# Patient Record
Sex: Female | Born: 1970 | Race: White | Hispanic: No | Marital: Married | Smoking: Current every day smoker
Health system: Southern US, Community
[De-identification: ages and names within clinical notes are randomized; demographics above are authoritative.]

## PROBLEM LIST (undated history)

## (undated) DIAGNOSIS — K635 Polyp of colon: Secondary | ICD-10-CM

## (undated) DIAGNOSIS — K219 Gastro-esophageal reflux disease without esophagitis: Secondary | ICD-10-CM

## (undated) DIAGNOSIS — E039 Hypothyroidism, unspecified: Secondary | ICD-10-CM

## (undated) DIAGNOSIS — E785 Hyperlipidemia, unspecified: Secondary | ICD-10-CM

## (undated) DIAGNOSIS — J45909 Unspecified asthma, uncomplicated: Secondary | ICD-10-CM

## (undated) HISTORY — DX: Hyperlipidemia, unspecified: E78.5

## (undated) HISTORY — DX: Hypothyroidism, unspecified: E03.9

## (undated) HISTORY — DX: Unspecified asthma, uncomplicated: J45.909

## (undated) HISTORY — DX: Polyp of colon: K63.5

## (undated) HISTORY — DX: Gastro-esophageal reflux disease without esophagitis: K21.9

## (undated) HISTORY — PX: TUBAL LIGATION: SHX77

---

## 2015-04-11 ENCOUNTER — Encounter: Payer: Self-pay | Admitting: Pediatrics

## 2015-04-11 ENCOUNTER — Ambulatory Visit (INDEPENDENT_AMBULATORY_CARE_PROVIDER_SITE_OTHER): Payer: Self-pay | Admitting: Pediatrics

## 2015-04-11 ENCOUNTER — Encounter (INDEPENDENT_AMBULATORY_CARE_PROVIDER_SITE_OTHER): Payer: Self-pay

## 2015-04-11 VITALS — BP 145/89 | HR 92 | Temp 97.0°F | Ht 64.0 in | Wt 138.0 lb

## 2015-04-11 DIAGNOSIS — E785 Hyperlipidemia, unspecified: Secondary | ICD-10-CM

## 2015-04-11 DIAGNOSIS — R8281 Pyuria: Secondary | ICD-10-CM

## 2015-04-11 DIAGNOSIS — R300 Dysuria: Secondary | ICD-10-CM

## 2015-04-11 DIAGNOSIS — Z716 Tobacco abuse counseling: Secondary | ICD-10-CM

## 2015-04-11 DIAGNOSIS — Z0000 Encounter for general adult medical examination without abnormal findings: Secondary | ICD-10-CM

## 2015-04-11 DIAGNOSIS — N390 Urinary tract infection, site not specified: Secondary | ICD-10-CM

## 2015-04-11 DIAGNOSIS — J4520 Mild intermittent asthma, uncomplicated: Secondary | ICD-10-CM

## 2015-04-11 LAB — POCT URINALYSIS DIPSTICK
BILIRUBIN UA: NEGATIVE
Blood, UA: NEGATIVE
GLUCOSE UA: NEGATIVE
KETONES UA: NEGATIVE
Nitrite, UA: NEGATIVE
PROTEIN UA: NEGATIVE
SPEC GRAV UA: 1.01
Urobilinogen, UA: NEGATIVE
pH, UA: 7.5

## 2015-04-11 LAB — POCT UA - MICROSCOPIC ONLY
BACTERIA, U MICROSCOPIC: NEGATIVE
CRYSTALS, UR, HPF, POC: NEGATIVE
Casts, Ur, LPF, POC: NEGATIVE
MUCUS UA: NEGATIVE
RBC, URINE, MICROSCOPIC: NEGATIVE
Yeast, UA: NEGATIVE

## 2015-04-11 MED ORDER — ALBUTEROL SULFATE 108 (90 BASE) MCG/ACT IN AEPB: 2.0000 | INHALATION_SPRAY | Freq: Four times a day (QID) | RESPIRATORY_TRACT | Status: DC | PRN

## 2015-04-11 MED ORDER — NITROFURANTOIN MONOHYD MACRO 100 MG PO CAPS: 100.0000 mg | ORAL_CAPSULE | Freq: Two times a day (BID) | ORAL | Status: DC

## 2015-04-11 NOTE — Progress Notes (Addendum)
Subjective:    Patient ID: Deborah Osborne, female    DOB: [birth year], 44 y.o.   MRN: [id number]  HPI: Deborah Osborne is a 44 y.o. female presenting on 04/11/2015 for New Patient (Initial Visit)  Burning at end of urination started 4-5 days ago, also some itching. No fevers.   Started smoking at 44yo.  Sometimes has a hard tim ebreathing in the heat. Smokes 2 ppd. When breathing trouble worsens, she will stop and rest and it improves. Has never needed to go to the hospital for it.   H/o abnormal pap at age 82yo, had cryotherapy at that time.  Last pap smear was over ten years ago, she reports it being normal.   Relevant past medical, surgical, family and social history reviewed and updated as indicated. Interim medical history since our last visit reviewed. Allergies and medications reviewed and updated.  Review of Systems  HENT: Negative.   Eyes: Negative.   Respiratory: Positive for cough and sputum production. Negative for hemoptysis.   Cardiovascular: Negative.   Gastrointestinal: Negative.   Genitourinary: Negative.   Musculoskeletal: Negative.   Skin: Negative.   Neurological: Negative.   Psychiatric/Behavioral: Negative.     ROS: Per HPI unless specifically indicated above  Past Medical History There are no active problems to display for this patient.   Current Outpatient Prescriptions  Medication Sig Dispense Refill  . acetaminophen (TYLENOL) 325 MG tablet Take 650 mg by mouth every 6 (six) hours as needed.    . Albuterol Sulfate (PROAIR RESPICLICK) 962 (90 BASE) MCG/ACT AEPB Inhale 2 puffs into the lungs every 6 (six) hours as needed. 1 each 2  . nitrofurantoin, macrocrystal-monohydrate, (MACROBID) 100 MG capsule Take 1 capsule (100 mg total) by mouth 2 (two) times daily. 10 capsule 0  . omeprazole (PRILOSEC OTC) 20 MG tablet Take 20 mg by mouth daily.     No current facility-administered medications for this visit.       Objective:    BP 145/89 mmHg   Pulse 92  Temp(Src) 97 F (36.1 C) (Oral)  Ht _0  (1.626 m)  Wt 138 lb (62.596 kg)  BMI 23.68 kg/m2  Wt Readings from Last 3 Encounters:  04/11/15 138 lb (62.596 kg)    Gen: NAD, alert, cooperative with exam, NCAT, coarse voice EYES: EOMI, no scleral injection or icterus ENT:  TMs pearly gray b/l, OP without erythema LYMPH: no cervical LAD NECK: normal thyroid, no nodules CV: NRRR, normal S1/S2, no murmur, DP pulses 2+ b/l Resp: moving air fair, scattered wheezes throughout, normal WOB Abd: +BS, soft, NTND. no guarding or organomegaly PELVIC: normal external female genitalia, no discharge. Parous, pink cervix with no lesions. Normal vaginal wall rugae, no lesions. Ext: No edema, warm Neuro: Alert and oriented, strength equal b/l UE and LE, coodrination grossly normal MSK: normal muscle bulk     Assessment & Plan:   Llewellyn was seen today for new patient (initial visit).  Diagnoses and all orders for this visit:  Encounter for preventive health examination -     Lipid panel -     Pap IG and HPV (high risk) DNA detection -     BMP8+EGFR  Dysuria With pyuria, macrobid BID for 5 days. Will f/u urine culture.  Asthma, chronic, mild intermittent, uncomplicated vs COPD Would benefit from PFTs but as pt is self pay now she declined. Gave below Rx, if using more than twice a week she needs to come back to be seen to start  a controller medicine. -     Albuterol Sulfate (PROAIR RESPICLICK) 841 (90 BASE) MCG/ACT AEPB; Inhale 2 puffs into the lungs every 6 (six) hours as needed.  Tobacco abuse counseling She is interested in quitting smoking. She has tried a couple times in the past but has gone back to smoking within 2-3 weeks because of stress. Talked about having a plan to quit before quitting. Pt would like to use patches to help. Encouraged her to write out a date and plan on a calendar and to think through what her triggers are (stress, work) and think of something else she can do  instead, such as go for walk. Follow up in 4 weeks or sooner if needed.    Follow up plan: In 4 weeks for smoking cessation or sooner if needed.  Assunta Found, MD [facility] 04/11/2015, 12:09 PM  ADDENDUM 04/18/2015 5:38 PM  Pap smear negative. No vaginal discharge, if itching starts rec monostat. Start pravastatin for hyperlipidemia. Called and discussed with pt, ASCVD 10 yr risk is 14.% given h/o smoking and current hypertension and high cholesterol levels. RTC 4-8 weeks to follow up smoking cessation.

## 2015-04-12 LAB — BMP8+EGFR
BUN/Creatinine Ratio: 10 (ref 9–23)
BUN: 9 mg/dL (ref 6–24)
CALCIUM: 9.9 mg/dL (ref 8.7–10.2)
CHLORIDE: 99 mmol/L (ref 97–108)
CO2: 21 mmol/L (ref 18–29)
Creatinine, Ser: 0.86 mg/dL (ref 0.57–1.00)
GFR, EST AFRICAN AMERICAN: 96 mL/min/{1.73_m2} (ref >59)
GFR, EST NON AFRICAN AMERICAN: 83 mL/min/{1.73_m2} (ref >59)
Glucose: 86 mg/dL (ref 65–99)
Potassium: 5 mmol/L (ref 3.5–5.2)
Sodium: 140 mmol/L (ref 134–144)

## 2015-04-12 LAB — LIPID PANEL
CHOLESTEROL TOTAL: 262 mg/dL — AB (ref 100–199)
Chol/HDL Ratio: 7.5 ratio units — ABNORMAL HIGH (ref 0.0–4.4)
HDL: 35 mg/dL — ABNORMAL LOW (ref >39)
LDL Calculated: 183 mg/dL — ABNORMAL HIGH (ref 0–99)
TRIGLYCERIDES: 219 mg/dL — AB (ref 0–149)
VLDL Cholesterol Cal: 44 mg/dL — ABNORMAL HIGH (ref 5–40)

## 2015-04-12 LAB — URINE CULTURE

## 2015-04-16 LAB — PAP IG AND HPV HIGH-RISK
HPV, high-risk: NEGATIVE
PAP Smear Comment: 0

## 2015-04-18 MED ORDER — PRAVASTATIN SODIUM 40 MG PO TABS: 40.0000 mg | ORAL_TABLET | Freq: Every day | ORAL | Status: DC

## 2015-04-18 NOTE — Addendum Note (Signed)
Addended by: Johna Sheriff on: 04/18/2015 05:38 PM   Modules accepted: Orders

## 2015-07-11 ENCOUNTER — Telehealth: Payer: Self-pay | Admitting: Pediatrics

## 2015-07-15 ENCOUNTER — Ambulatory Visit (INDEPENDENT_AMBULATORY_CARE_PROVIDER_SITE_OTHER): Payer: Self-pay | Admitting: Family Medicine

## 2015-07-15 ENCOUNTER — Encounter: Payer: Self-pay | Admitting: Family Medicine

## 2015-07-15 ENCOUNTER — Ambulatory Visit (INDEPENDENT_AMBULATORY_CARE_PROVIDER_SITE_OTHER): Payer: Self-pay

## 2015-07-15 VITALS — BP 141/81 | HR 81 | Temp 97.6°F | Ht 64.0 in | Wt 140.8 lb

## 2015-07-15 DIAGNOSIS — W19XXXA Unspecified fall, initial encounter: Secondary | ICD-10-CM

## 2015-07-15 DIAGNOSIS — W0110XA Fall on same level from slipping, tripping and stumbling with subsequent striking against unspecified object, initial encounter: Secondary | ICD-10-CM

## 2015-07-15 DIAGNOSIS — M25562 Pain in left knee: Secondary | ICD-10-CM

## 2015-07-15 DIAGNOSIS — L03011 Cellulitis of right finger: Secondary | ICD-10-CM

## 2015-07-15 MED ORDER — SULFAMETHOXAZOLE-TRIMETHOPRIM 800-160 MG PO TABS: 1.0000 | ORAL_TABLET | Freq: Two times a day (BID) | ORAL | Status: DC

## 2015-07-15 NOTE — Progress Notes (Signed)
BP 141/81 mmHg  Pulse 81  Temp(Src) 97.6 F (36.4 C) (Oral)  Ht 5' 4"  (1.626 m)  Wt 140 lb 12.8 oz (63.866 kg)  BMI 24.16 kg/m2  LMP 07/15/2015   Subjective:    Patient ID: Deborah Osborne, female    DOB: 1971/06/08, 44 y.o.   MRN: [id number]  HPI: Deborah Osborne is a 44 y.o. 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 Since that time she has developed swelling and redness and inflammation in significant pain. Yesterday she lanced on her own and use peroxide. Today it came back just as vague and painful. She denies any fevers or chills. She denies any skin infections anywhere else. In her profession she is crawling around a lot of dirty spaces on people's houses.  Fall Today while leaving her house to come to our office she fell and landed right on her left knee on a curb. She is having significant pain over the patella right now because she just did it. She has a small abrasion on her anterior left knee, but no bruising.  Relevant past medical, surgical, family and social history reviewed and updated as indicated. Interim medical history since our last visit reviewed. Allergies and medications reviewed and updated.  Review of Systems  Constitutional: Negative for fever and chills.  HENT: Negative for congestion, ear discharge and ear pain.   Eyes: Negative for redness and visual disturbance.  Respiratory: Negative for chest tightness and shortness of breath.   Cardiovascular: Negative for chest pain and leg swelling.  Genitourinary: Negative for dysuria and difficulty urinating.  Musculoskeletal: Positive for arthralgias. Negative for back pain, joint swelling and gait problem.  Skin: Positive for color change and rash.  Neurological: Negative for light-headedness and headaches.  Psychiatric/Behavioral: Negative  for behavioral problems and agitation.  All other systems reviewed and are negative.   Per HPI unless specifically indicated above     Medication List       This list is accurate as of: 07/15/15  9:24 AM.  Always use your most recent med list.               Albuterol Sulfate 108 (90 BASE) MCG/ACT Aepb  Commonly known as:  PROAIR RESPICLICK  Inhale 2 puffs into the lungs every 6 (six) hours as needed.     omeprazole 20 MG tablet  Commonly known as:  PRILOSEC OTC  Take 20 mg by mouth daily.     pravastatin 40 MG tablet  Commonly known as:  PRAVACHOL  Take 1 tablet (40 mg total) by mouth daily.     sulfamethoxazole-trimethoprim 800-160 MG tablet  Commonly known as:  BACTRIM DS,SEPTRA DS  Take 1 tablet by mouth 2 (two) times daily.           Objective:    BP 141/81 mmHg  Pulse 81  Temp(Src) 97.6 F (36.4 C) (Oral)  Ht 5' 4"  (1.626 m)  Wt 140 lb 12.8 oz (63.866 kg)  BMI 24.16 kg/m2  LMP 07/15/2015  Wt Readings from Last 3 Encounters:  07/15/15 140 lb 12.8 oz (63.866 kg)  04/11/15 138 lb (62.596 kg)    Physical Exam  Constitutional: She is oriented to person, place, and time. She appears well-developed and well-nourished. No distress.  Eyes: Conjunctivae and EOM are normal. Pupils are equal, round,  and reactive to light.  Cardiovascular: Normal rate, regular rhythm, normal heart sounds and intact distal pulses.   No murmur heard. Pulmonary/Chest: Effort normal and breath sounds normal. No respiratory distress. She has no wheezes.  Musculoskeletal: Normal range of motion. She exhibits no edema or tenderness.  Neurological: She is alert and oriented to person, place, and time. Coordination normal.  Skin: Skin is warm and dry. No rash noted. She is not diaphoretic.     Psychiatric: She has a normal mood and affect. Her behavior is normal.  Nursing note and vitals reviewed.   Results for orders placed or performed in visit on 04/11/15  Urine culture  Result  Value Ref Range   Urine Culture, Routine Final report    Urine Culture result 1 Comment   Lipid panel  Result Value Ref Range   Cholesterol, Total 262 (H) 100 - 199 mg/dL   Triglycerides 219 (H) 0 - 149 mg/dL   HDL 35 (L) >39 mg/dL   VLDL Cholesterol Cal 44 (H) 5 - 40 mg/dL   LDL Calculated 183 (H) 0 - 99 mg/dL   Chol/HDL Ratio 7.5 (H) 0.0 - 4.4 ratio units  BMP8+EGFR  Result Value Ref Range   Glucose 86 65 - 99 mg/dL   BUN 9 6 - 24 mg/dL   Creatinine, Ser 0.86 0.57 - 1.00 mg/dL   GFR calc non Af Amer 83 >59 mL/min/1.73   GFR calc Af Amer 96 >59 mL/min/1.73   BUN/Creatinine Ratio 10 9 - 23   Sodium 140 134 - 144 mmol/L   Potassium 5.0 3.5 - 5.2 mmol/L   Chloride 99 97 - 108 mmol/L   CO2 21 18 - 29 mmol/L   Calcium 9.9 8.7 - 10.2 mg/dL  POCT UA - Microscopic Only  Result Value Ref Range   WBC, Ur, HPF, POC 15-20    RBC, urine, microscopic NEG    Bacteria, U Microscopic NEG    Mucus, UA NEG    Epithelial cells, urine per micros OCC    Crystals, Ur, HPF, POC NEG    Casts, Ur, LPF, POC NEG    Yeast, UA NEG   POCT urinalysis dipstick  Result Value Ref Range   Color, UA YELLOW    Clarity, UA CLOUDY    Glucose, UA NEG    Bilirubin, UA NEG    Ketones, UA NEG    Spec Grav, UA 1.010    Blood, UA NEG    pH, UA 7.5    Protein, UA NEG    Urobilinogen, UA negative    Nitrite, UA NEG    Leukocytes, UA moderate (2+) (A) Negative  Pap IG and HPV (high risk) DNA detection  Result Value Ref Range   DIAGNOSIS: Comment    Specimen adequacy: Comment    CLINICIAN PROVIDED ICD10: Comment    Performed by: Comment    PAP SMEAR COMMENT .    PATHOLOGIST PROVIDED ICD10: Comment    Note: Comment    Test Methodology Comment    HPV, high-risk Negative Negative      Assessment & Plan:   Problem List Items Addressed This Visit    None    Visit Diagnoses    Fall, initial encounter    -  Primary    No signs of fracture on x-ray, await radiology read, likely contusion use NSAIDs     Relevant Orders    DG Knee 1-2 Views Left    Cellulitis of finger of right hand  Middle finger, lanced abscess and sent for culture    Relevant Medications    sulfamethoxazole-trimethoprim (BACTRIM DS,SEPTRA DS) 800-160 MG tablet    Other Relevant Orders    Anaerobic and Aerobic Culture        Follow up plan: Return if symptoms worsen or fail to improve.  Counseling provided for all of the vaccine components Orders Placed This Encounter  Procedures  . Anaerobic and Aerobic Culture  . DG Knee 1-2 Views Left    Caryl Pina, MD [facility] 07/15/2015, 9:24 AM

## 2015-07-19 LAB — ANAEROBIC AND AEROBIC CULTURE

## 2015-08-11 HISTORY — PX: CHOLECYSTECTOMY: SHX55

## 2016-01-16 DIAGNOSIS — K635 Polyp of colon: Secondary | ICD-10-CM

## 2016-01-16 HISTORY — DX: Polyp of colon: K63.5

## 2016-05-21 ENCOUNTER — Ambulatory Visit (INDEPENDENT_AMBULATORY_CARE_PROVIDER_SITE_OTHER): Payer: Self-pay | Admitting: Pediatrics

## 2016-05-21 ENCOUNTER — Encounter: Payer: Self-pay | Admitting: Pediatrics

## 2016-05-21 VITALS — BP 134/83 | HR 71 | Temp 98.2°F | Ht 64.0 in | Wt 134.2 lb

## 2016-05-21 DIAGNOSIS — Z23 Encounter for immunization: Secondary | ICD-10-CM

## 2016-05-21 DIAGNOSIS — F439 Reaction to severe stress, unspecified: Secondary | ICD-10-CM | POA: Insufficient documentation

## 2016-05-21 DIAGNOSIS — Z720 Tobacco use: Secondary | ICD-10-CM

## 2016-05-21 DIAGNOSIS — E782 Mixed hyperlipidemia: Secondary | ICD-10-CM | POA: Insufficient documentation

## 2016-05-21 DIAGNOSIS — E785 Hyperlipidemia, unspecified: Secondary | ICD-10-CM

## 2016-05-21 DIAGNOSIS — Z9049 Acquired absence of other specified parts of digestive tract: Secondary | ICD-10-CM | POA: Insufficient documentation

## 2016-05-21 MED ORDER — PRAVASTATIN SODIUM 40 MG PO TABS: 40.0000 mg | ORAL_TABLET | Freq: Every day | ORAL | 3 refills | Status: DC

## 2016-05-21 NOTE — Patient Instructions (Signed)
Pravastatin nightly

## 2016-05-21 NOTE — Progress Notes (Signed)
  Subjective:   Patient ID: Deborah Osborne, female    DOB: 09/26/1970, 45 y.o.   MRN: [id number] CC: Medication Refill (pravastatin)  HPI: Deborah Osborne is a 45 y.o. female presenting for Medication Refill (pravastatin)  HLD: Takes pravastatin nightly No side effects  Tobacco use: Nerves on edge Lots of stress from kids One with opioid addiction  Had gall bladder removed since last visit  Stress at home: Opioid addiction as above in child  Relevant past medical, surgical, family and social history reviewed. Allergies and medications reviewed and updated. History  Smoking Status  . Current Every Day Smoker  . Packs/day: 2.00  . Years: 20.00  . Types: Cigarettes  Smokeless Tobacco  . Never Used   ROS: Per HPI   Objective:    BP 134/83   Pulse 71   Temp 98.2 F (36.8 C) (Oral)   Ht 5\' 4"  (1.626 m)   Wt 134 lb 3.2 oz (60.9 kg)   BMI 23.04 kg/m   Wt Readings from Last 3 Encounters:  05/21/16 134 lb 3.2 oz (60.9 kg)  07/15/15 140 lb 12.8 oz (63.9 kg)  04/11/15 138 lb (62.6 kg)    Gen: NAD, alert, cooperative with exam, NCAT, slightly hoarse EYES: EOMI, no conjunctival injection, or no icterus ENT:   OP without erythema LYMPH: no cervical LAD CV: NRRR, normal S1/S2, no murmur, distal pulses 2+ b/l Resp: CTABL, no wheezes, normal WOB Ext: No edema, warm Neuro: Alert and oriented, strength equal b/l UE and LE, coordination grossly normal MSK: normal muscle bulk  Assessment & Plan:  Deborah Osborne was seen today for medication refill.  Diagnoses and all orders for this visit:  Hyperlipidemia, unspecified hyperlipidemia type No side effects, cont medication -     pravastatin (PRAVACHOL) 40 MG tablet; Take 1 tablet (40 mg total) by mouth daily.  Tobacco use Pre-contemplative Has patches at home  S/P cholecystectomy No further abd pain  Stress at home Discussed starting daily medication such as citalopram, pt not interested t this time but will let me  know  Follow up plan: Return in about 1 year (around 05/21/2017). Rex Krasarol Vincent, MD [facility]

## 2016-05-25 ENCOUNTER — Ambulatory Visit (INDEPENDENT_AMBULATORY_CARE_PROVIDER_SITE_OTHER): Payer: Self-pay | Admitting: Family Medicine

## 2016-05-25 ENCOUNTER — Telehealth: Payer: Self-pay | Admitting: Pediatrics

## 2016-05-25 ENCOUNTER — Encounter: Payer: Self-pay | Admitting: Family Medicine

## 2016-05-25 VITALS — BP 112/74 | HR 95 | Temp 97.2°F | Ht 64.0 in | Wt 136.4 lb

## 2016-05-25 DIAGNOSIS — S39012A Strain of muscle, fascia and tendon of lower back, initial encounter: Secondary | ICD-10-CM

## 2016-05-25 MED ORDER — CYCLOBENZAPRINE HCL 10 MG PO TABS: 10.0000 mg | ORAL_TABLET | Freq: Three times a day (TID) | ORAL | 0 refills | Status: DC | PRN

## 2016-05-25 NOTE — Progress Notes (Signed)
BP 112/74   Pulse 95   Temp 97.2 F (36.2 C) (Oral)   Ht 5\' 4"  (1.626 m)   Wt 136 lb 6 oz (61.9 kg)   BMI 23.41 kg/m    Subjective:    Patient ID: Deborah Osborne, female    DOB: 04/10/1971, 45 y.o.   MRN: [id number]  HPI: Deborah Osborne is a 45 y.o. female presenting on 05/25/2016 for Back Pain (low back pain, began after pulling a bed while at work)   HPI Low back pain Patient has low back pain that does not radiate down her leg or cause any numbness or weakness. The pain began about 4 days ago when she was pulling on something at work and has been sore since. She denies any inability to walk or any numbness or weakness in either of the legs. She denies any fevers or chills or overlying redness or warmth in her lower back.  Relevant past medical, surgical, family and social history reviewed and updated as indicated. Interim medical history since our last visit reviewed. Allergies and medications reviewed and updated.  Review of Systems  Constitutional: Negative for chills and fever.  HENT: Negative for congestion, ear discharge and ear pain.   Eyes: Negative for redness and visual disturbance.  Respiratory: Negative for chest tightness and shortness of breath.   Cardiovascular: Negative for chest pain and leg swelling.  Endocrine: Negative for cold intolerance.  Genitourinary: Negative for difficulty urinating and dysuria.  Musculoskeletal: Positive for back pain. Negative for gait problem.  Skin: Negative for rash.  Neurological: Negative for dizziness, light-headedness and headaches.  Psychiatric/Behavioral: Negative for agitation and behavioral problems.  All other systems reviewed and are negative.   Per HPI unless specifically indicated above     Objective:    BP 112/74   Pulse 95   Temp 97.2 F (36.2 C) (Oral)   Ht 5\' 4"  (1.626 m)   Wt 136 lb 6 oz (61.9 kg)   BMI 23.41 kg/m   Wt Readings from Last 3 Encounters:  05/25/16 136 lb 6 oz (61.9 kg)  05/21/16  134 lb 3.2 oz (60.9 kg)  07/15/15 140 lb 12.8 oz (63.9 kg)    Physical Exam  Constitutional: She is oriented to person, place, and time. She appears well-developed and well-nourished. No distress.  Eyes: Conjunctivae are normal.  Cardiovascular: Normal rate, regular rhythm, normal heart sounds and intact distal pulses.   No murmur heard. Pulmonary/Chest: Effort normal and breath sounds normal. No respiratory distress. She has no wheezes.  Musculoskeletal: Normal range of motion. She exhibits no edema.       Lumbar back: She exhibits tenderness. She exhibits normal range of motion, no swelling and normal pulse.  Neurological: She is alert and oriented to person, place, and time. Coordination normal.  Skin: Skin is warm and dry. No rash noted. She is not diaphoretic.  Psychiatric: She has a normal mood and affect. Her behavior is normal.  Nursing note and vitals reviewed.     Assessment & Plan:   Problem List Items Addressed This Visit    None    Visit Diagnoses    Lumbar strain, initial encounter    -  Primary   Low back strain while lifting 4 days ago, recommended Advil and muscle relaxer and heating pads and stretching   Relevant Medications   cyclobenzaprine (FLEXERIL) 10 MG tablet      Follow up plan: Return if symptoms worsen or fail to improve.  Counseling provided  for all of the vaccine components No orders of the defined types were placed in this encounter.   Arville Care, MD [facility] 05/25/2016, 2:48 PM

## 2016-05-25 NOTE — Telephone Encounter (Signed)
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 Informed her she would need to be seen for an x-ray to be order an for treatment. Appt was made

## 2016-11-09 ENCOUNTER — Ambulatory Visit (INDEPENDENT_AMBULATORY_CARE_PROVIDER_SITE_OTHER): Payer: Self-pay | Admitting: Pediatrics

## 2016-11-09 ENCOUNTER — Encounter (INDEPENDENT_AMBULATORY_CARE_PROVIDER_SITE_OTHER): Payer: Self-pay

## 2016-11-09 ENCOUNTER — Encounter: Payer: Self-pay | Admitting: Pediatrics

## 2016-11-09 VITALS — BP 132/80 | HR 92 | Temp 97.8°F | Resp 20 | Ht 64.0 in | Wt 138.4 lb

## 2016-11-09 DIAGNOSIS — J441 Chronic obstructive pulmonary disease with (acute) exacerbation: Secondary | ICD-10-CM

## 2016-11-09 MED ORDER — AZITHROMYCIN 250 MG PO TABS: ORAL_TABLET | ORAL | 0 refills | Status: DC

## 2016-11-09 MED ORDER — ALBUTEROL SULFATE (2.5 MG/3ML) 0.083% IN NEBU: 2.5000 mg | INHALATION_SOLUTION | Freq: Four times a day (QID) | RESPIRATORY_TRACT | 1 refills | Status: AC | PRN

## 2016-11-09 MED ORDER — PREDNISONE 20 MG PO TABS
ORAL_TABLET | ORAL | 0 refills | Status: DC
Start: 2016-11-09 — End: 2017-06-07

## 2016-11-09 NOTE — Progress Notes (Signed)
  Subjective:   Patient ID: Deborah Osborne, female    DOB: [birth year], 46 y.o.   MRN: [id number] CC: Facial Pain; Shortness of Breath; Cough; and Chest congestion  HPI: Deborah Osborne is a 46 y.o. female presenting for Facial Pain; Shortness of Breath; Cough; and Chest congestion  Ongoing symptoms about a week Started with URI symptoms Waking up at night having more SOB Used her sisters nebulizer, helped with breathing Feeling some SOB, sometimes at night Coughing a lot more than usual Coughing up green more  Tobacco use: About 2ppd Has patches at home Wants to quit  Relevant past medical, surgical, family and social history reviewed. Allergies and medications reviewed and updated. History  Smoking Status  . Current Every Day Smoker  . Packs/day: 2.00  . Years: 20.00  . Types: Cigarettes  Smokeless Tobacco  . Never Used   ROS: Per HPI   Objective:    BP 132/80   Pulse 92   Temp 97.8 F (36.6 C) (Oral)   Resp 20   Ht  (1.626 m)   Wt 138 lb 6.4 oz (62.8 kg)   SpO2 98%   BMI 23.76 kg/m   Wt Readings from Last 3 Encounters:  11/09/16 138 lb 6.4 oz (62.8 kg)  05/25/16 136 lb 6 oz (61.9 kg)  05/21/16 134 lb 3.2 oz (60.9 kg)    Gen: NAD, alert, cooperative with exam, NCAT EYES: EOMI, no conjunctival injection, or no icterus ENT:  TMs dull gray b/l, no effusion, OP with mild erythema LYMPH: no cervical LAD CV: NRRR, normal S1/S2, no murmur, distal pulses 2+ b/l Resp: moving air fair, prolongd exp, wheezing throughout with forced exhalation, comfortable WOB Abd: +BS, soft, NTND. no guarding or organomegaly Ext: No edema, warm Neuro: Alert and oriented MSK: normal muscle bulk  Assessment & Plan:  Daleyza was seen today for facial pain, shortness of breath, cough and chest congestion.  Diagnoses and all orders for this visit:  COPD exacerbation (HCC) Treat as below Discussed tobacco smoking cessation strategies, return precautions -     predniSONE  (DELTASONE) 20 MG tablet; 2 po at same time daily for 4 days -     azithromycin (ZITHROMAX) 250 MG tablet; Take 2 the first day and then one each day after. -     DME Nebulizer machine -     albuterol (PROVENTIL) (2.5 MG/3ML) 0.083% nebulizer solution; Take 3 mLs (2.5 mg total) by nebulization every 6 (six) hours as needed for wheezing or shortness of breath.   Follow up plan: 3 mo, sooner if needed Rex Kras, MD [facility]

## 2016-12-03 IMAGING — CR DG KNEE 1-2V*L*
2 series · 2 of 2 positions shown · non-contrast
Comparison: None.

CLINICAL DATA: Pain following fall

EXAM:
LEFT KNEE - 1-2 VIEW

[view not recorded (1 of 2)]
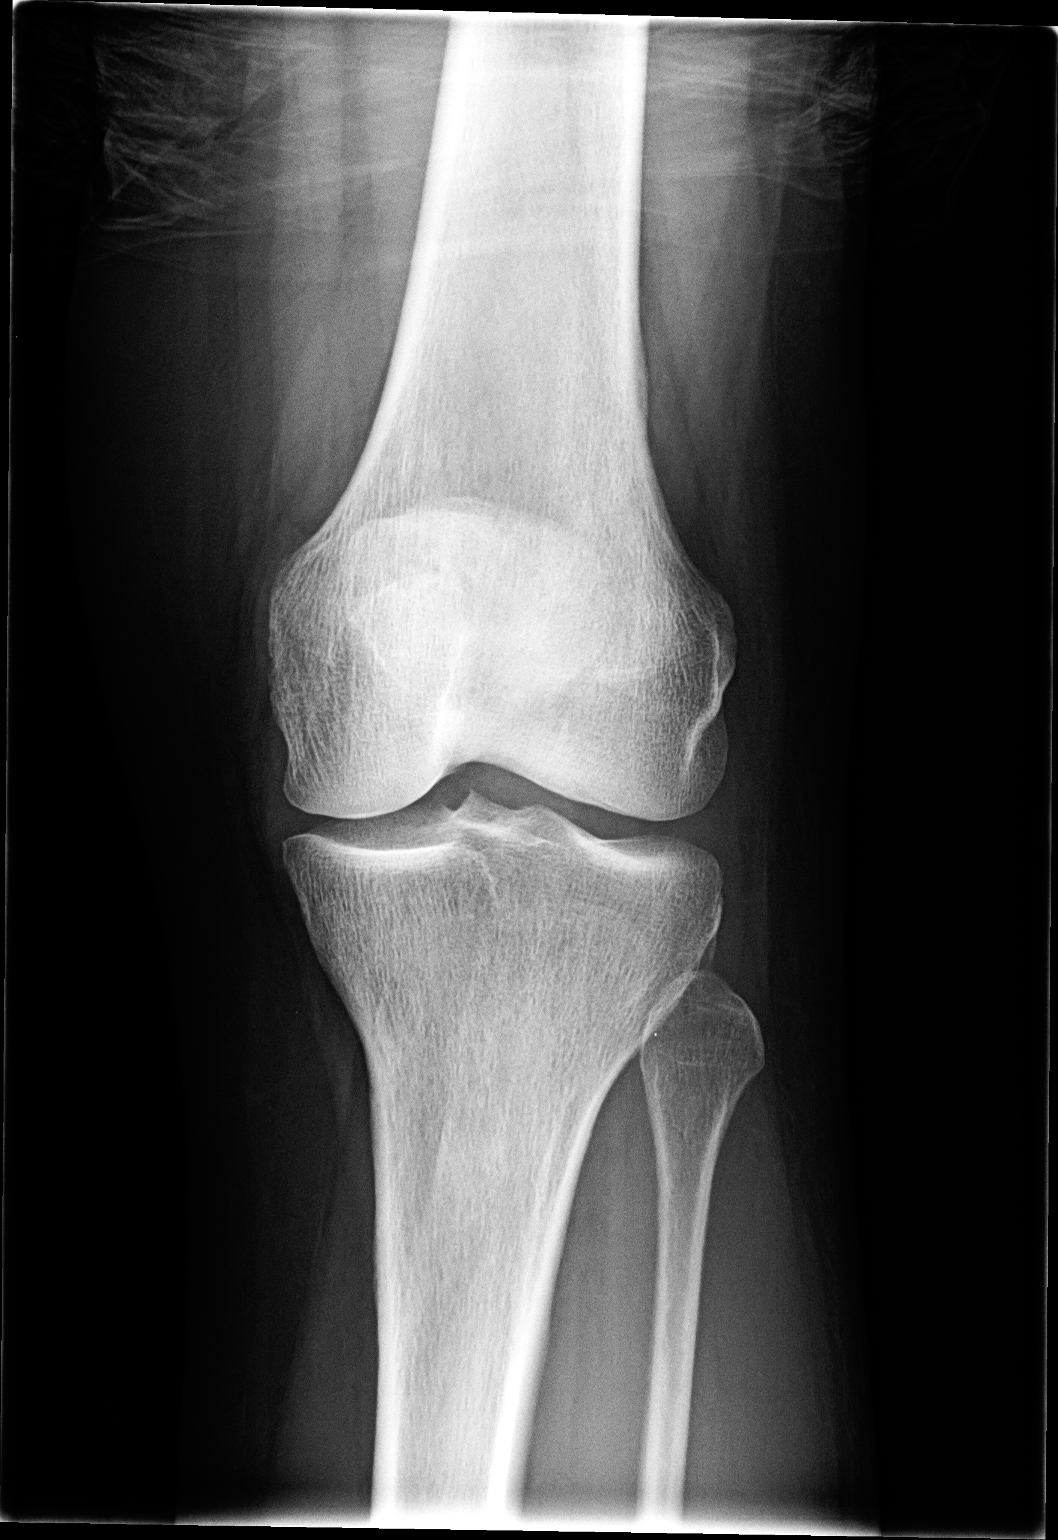

[view not recorded (2 of 2)]
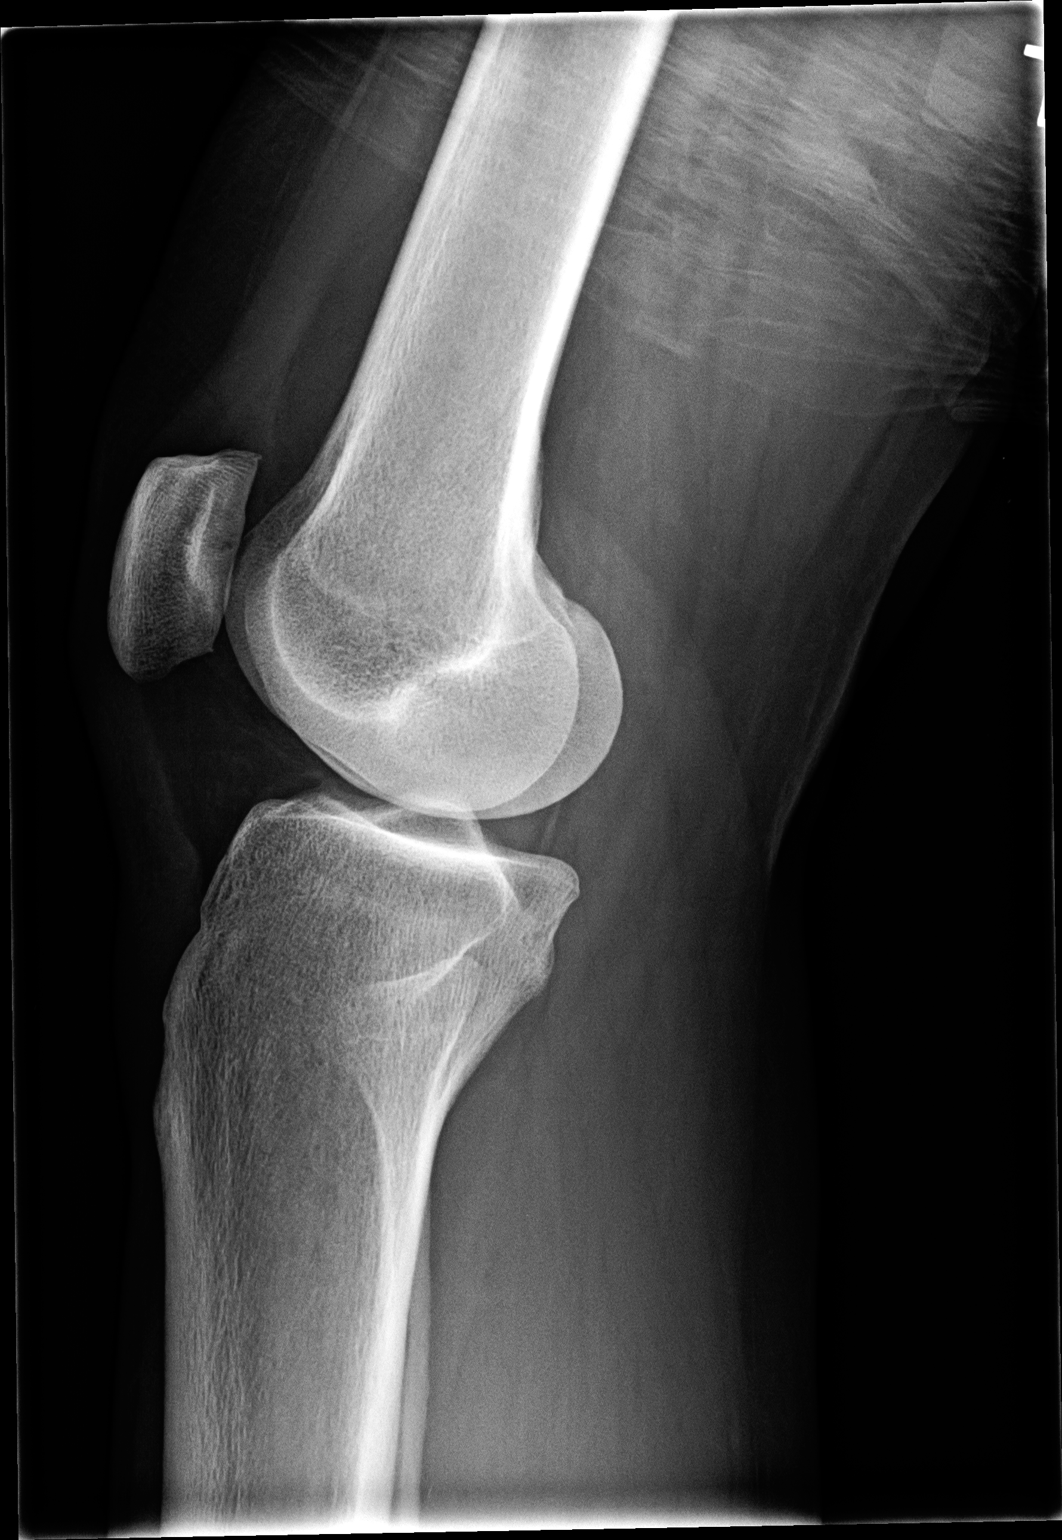

[2 of 2 positions shown; findings below may reference images not displayed]

FINDINGS: Frontal and lateral views obtained. There is no demonstrable
fracture or dislocation. No joint effusion. Joint spaces appear
intact. No erosive change.
IMPRESSION: No apparent fracture or effusion.  No appreciable arthropathy.

## 2017-05-27 ENCOUNTER — Other Ambulatory Visit: Payer: Self-pay | Admitting: *Deleted

## 2017-05-27 DIAGNOSIS — E785 Hyperlipidemia, unspecified: Secondary | ICD-10-CM

## 2017-05-29 ENCOUNTER — Other Ambulatory Visit: Payer: Self-pay | Admitting: Pediatrics

## 2017-05-29 DIAGNOSIS — E785 Hyperlipidemia, unspecified: Secondary | ICD-10-CM

## 2017-05-31 NOTE — Telephone Encounter (Signed)
LMOVM Rx has been sent to pharmacy, please call office to make yearly appointment

## 2017-05-31 NOTE — Telephone Encounter (Signed)
Sent to [facility] for approval last OV April

## 2017-06-07 ENCOUNTER — Encounter: Payer: Self-pay | Admitting: Pediatrics

## 2017-06-07 ENCOUNTER — Ambulatory Visit (INDEPENDENT_AMBULATORY_CARE_PROVIDER_SITE_OTHER): Payer: Self-pay | Admitting: Pediatrics

## 2017-06-07 VITALS — BP 122/76 | HR 67 | Temp 98.3°F | Ht 64.0 in | Wt 136.0 lb

## 2017-06-07 DIAGNOSIS — Z720 Tobacco use: Secondary | ICD-10-CM

## 2017-06-07 DIAGNOSIS — E785 Hyperlipidemia, unspecified: Secondary | ICD-10-CM

## 2017-06-07 DIAGNOSIS — Z23 Encounter for immunization: Secondary | ICD-10-CM

## 2017-06-07 MED ORDER — BUPROPION HCL ER (SR) 150 MG PO TB12: 150.0000 mg | ORAL_TABLET | Freq: Two times a day (BID) | ORAL | 3 refills | Status: DC

## 2017-06-07 MED ORDER — PRAVASTATIN SODIUM 40 MG PO TABS: 40.0000 mg | ORAL_TABLET | Freq: Every day | ORAL | 4 refills | Status: DC

## 2017-06-07 NOTE — Progress Notes (Signed)
  Subjective:   Patient ID: Deborah Osborne, female    DOB: 10/18/1970, 46 y.o.   MRN: [id number] CC: Medication Refill (Pravastatin)  HPI: Deborah Osborne is a 46 y.o. 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 Allergies and medications reviewed and updated. History  Smoking Status  . Current Every Day Smoker  . Packs/day: 2.00  . Years: 20.00  . Types: Cigarettes  Smokeless Tobacco  . Never Used   ROS: Per HPI   Objective:    BP 122/76   Pulse 67   Temp 98.3 F (36.8 C) (Oral)   Ht 5\' 4"  (1.626 m)   Wt 136 lb (61.7 kg)   BMI 23.34 kg/m   Wt Readings from Last 3 Encounters:  06/07/17 136 lb (61.7 kg)  11/09/16 138 lb 6.4 oz (62.8 kg)  05/25/16 136 lb 6 oz (61.9 kg)    Gen: NAD, alert, cooperative with exam, NCAT EYES: EOMI, no conjunctival injection, or no icterus ENT:  TMs pearly gray b/l, OP without erythema LYMPH: no cervical LAD CV: NRRR, normal S1/S2, no murmur, distal pulses 2+ b/l Resp: CTABL, no wheezes, normal WOB Abd: +BS, soft, NTND.  Ext: No edema, warm Neuro: Alert and oriented  Assessment & Plan:  Deborah Osborne was seen today for medication refill.  Diagnoses and all orders for this visit:  Tobacco abuse Strategies discussed, start below two weeks before quit date -     buPROPion (WELLBUTRIN SR) 150 MG 12 hr tablet; Take 1 tablet (150 mg total) by mouth 2 (two) times daily.  Hyperlipidemia, unspecified hyperlipidemia type Stable, cont below -     pravastatin (PRAVACHOL) 40 MG tablet;  Take 1 tablet (40 mg total) by mouth daily.  Need for immunization against influenza -     Flu Vaccine QUAD 36+ mos IM   Follow up plan: Return in about 1 year (around 06/07/2018). Rex Krasarol Vincent, MD [facility]

## 2017-07-09 ENCOUNTER — Ambulatory Visit (INDEPENDENT_AMBULATORY_CARE_PROVIDER_SITE_OTHER): Payer: Self-pay | Admitting: Family Medicine

## 2017-07-09 ENCOUNTER — Encounter: Payer: Self-pay | Admitting: Family Medicine

## 2017-07-09 VITALS — BP 112/70 | HR 74 | Temp 97.2°F | Ht 64.0 in | Wt 135.0 lb

## 2017-07-09 DIAGNOSIS — J0100 Acute maxillary sinusitis, unspecified: Secondary | ICD-10-CM

## 2017-07-09 MED ORDER — PSEUDOEPHEDRINE-GUAIFENESIN ER 60-600 MG PO TB12: 1.0000 | ORAL_TABLET | Freq: Two times a day (BID) | ORAL | 0 refills | Status: DC

## 2017-07-09 MED ORDER — AMOXICILLIN-POT CLAVULANATE 875-125 MG PO TABS: 1.0000 | ORAL_TABLET | Freq: Two times a day (BID) | ORAL | 0 refills | Status: DC

## 2017-07-09 NOTE — Progress Notes (Signed)
Chief Complaint  Patient presents with  . Sinusitis    cough, pressure, sneezing , watery eyes    HPI  Patient presents today for Patient presents with upper respiratory congestion. Rhinorrhea that is frequently green and purulent. There is moderate sore throat. Patient reports coughing frequently with sinus pressure as well.  Green sputum noted. There is no fever, chills or sweats. The patient denies being short of breath. Onset was 3 weeks ago. Gradually worsening. Tried OTCs without improvement.  PMH: Smoking status noted ROS: Per HPI  Objective: BP 112/70 (BP Location: Left Arm)   Pulse 74   Temp (!) 97.2 F (36.2 C) (Oral)   Ht 5\' 4"  (1.626 m)   Wt 135 lb (61.2 kg)   BMI 23.17 kg/m  Gen: NAD, alert, cooperative with exam HEENT: NCAT, Nasal passages swollen, red TMS RED CV: RRR, good S1/S2, no murmur Resp: Bronchitis changes with scattered wheezes, non-labored Ext: No edema, warm Neuro: Alert and oriented, No gross deficits  Assessment and plan:  1. Acute maxillary sinusitis, recurrence not specified     Meds ordered this encounter  Medications  . amoxicillin-clavulanate (AUGMENTIN) 875-125 MG tablet    Sig: Take 1 tablet by mouth 2 (two) times daily.    Dispense:  20 tablet    Refill:  0  . pseudoephedrine-guaifenesin (MUCINEX D) 60-600 MG 12 hr tablet    Sig: Take 1 tablet by mouth every 12 (twelve) hours.    Dispense:  12 tablet    Refill:  0    No orders of the defined types were placed in this encounter.   Follow up as needed.  Mechele ClaudeWarren Pharaoh Pio, MD

## 2017-11-11 ENCOUNTER — Encounter: Payer: Self-pay | Admitting: Pediatrics

## 2017-11-11 ENCOUNTER — Ambulatory Visit (INDEPENDENT_AMBULATORY_CARE_PROVIDER_SITE_OTHER): Payer: Self-pay | Admitting: Pediatrics

## 2017-11-11 VITALS — BP 120/78 | HR 92 | Temp 97.6°F | Ht 64.0 in | Wt 135.4 lb

## 2017-11-11 DIAGNOSIS — K5900 Constipation, unspecified: Secondary | ICD-10-CM

## 2017-11-11 NOTE — Progress Notes (Signed)
  Subjective:   Patient ID: Deborah Osborne, female    DOB: 08/05/1971, 47 y.o.   MRN: [id number] CC: Constipation (3 days)  HPI: Deborah Osborne is a 47 y.o. female presenting for Constipation (3 days)  Has episodes of constipation, started again a few days ago. Hurt to pass stool 3 days ago, large stools, pain with sitting the next day. Now improved. Wants to prevent from happening again. Periods of constipation that last a few days happened off and on for months. Sometimes with blood on toilet paper when she wipes. Has miralax at home, hasnt used it. Drinks about 1.5 she thinks 12oz bottles of water a day, 2 mountain dews. Eating some fiber in diet. Usually has regular, most days stooling when she isnt constipated.  Says sister had to have part of colon removed 2 years ago, she is in early 9450s now. Sister was told by her GI doctor that all of her siblings need to have colonoscopy. Pt isnt sure what sisters diagnosis is.   Relevant past medical, surgical, family and social history reviewed. Allergies and medications reviewed and updated. Social History   Tobacco Use  Smoking Status Current Every Day Smoker  . Packs/day: 2.00  . Years: 20.00  . Pack years: 40.00  . Types: Cigarettes  Smokeless Tobacco Never Used   ROS: Per HPI   Objective:    BP 120/78   Pulse 92   Temp 97.6 F (36.4 C) (Oral)   Ht 5\' 4"  (1.626 m)   Wt 135 lb 6.4 oz (61.4 kg)   BMI 23.24 kg/m   Wt Readings from Last 3 Encounters:  11/11/17 135 lb 6.4 oz (61.4 kg)  07/09/17 135 lb (61.2 kg)  06/07/17 136 lb (61.7 kg)    Gen: NAD, alert, cooperative with exam, NCAT EYES: EOMI, no conjunctival injection, or no icterus CV: NRRR, normal S1/S2, no murmur Resp: CTABL, no wheezes, normal WOB Abd: +BS, soft, NTND. no guarding or organomegaly Ext: No edema, warm Neuro: Alert and oriented, strength equal b/l UE and LE, coordination grossly normal MSK: normal muscle bulk  Assessment & Plan:  Deborah Osborne was seen today  for constipation.  Diagnoses and all orders for this visit:  Constipation, unspecified constipation type Increase fluid intake, increase fiber, miralax daily  Family h/o polyps -     Ambulatory referral to Gastroenterology   Follow up plan: Return if symptoms worsen or fail to improve. Rex Krasarol Hendrick Pavich, MD [facility]

## 2017-11-11 NOTE — Patient Instructions (Addendum)
Increase water intake   Constipation, Adult Constipation is when a person:  Poops (has a bowel movement) fewer times in a week than normal.  Has a hard time pooping.  Has poop that is dry, hard, or bigger than normal.  Follow these instructions at home: Eating and drinking   Eat foods that have a lot of fiber, such as: ? Fresh fruits and vegetables. ? Whole grains. ? Beans.  Eat less of foods that are high in fat, low in fiber, or overly processed, such as: ? [place] fries. ? Hamburgers. ? Cookies. ? Candy. ? Soda.  Drink enough fluid to keep your pee (urine) clear or pale yellow. General instructions  Exercise regularly or as told by your doctor.  Go to the restroom when you feel like you need to poop. Do not hold it in.  Take over-the-counter and prescription medicines only as told by your doctor. These include any fiber supplements.  Do pelvic floor retraining exercises, such as: ? Doing deep breathing while relaxing your lower belly (abdomen). ? Relaxing your pelvic floor while pooping.  Watch your condition for any changes.  Keep all follow-up visits as told by your doctor. This is important. Contact a doctor if:  You have pain that gets worse.  You have a fever.  You have not pooped for 4 days.  You throw up (vomit).  You are not hungry.  You lose weight.  You are bleeding from the anus.  You have thin, pencil-like poop (stool). Get help right away if:  You have a fever, and your symptoms suddenly get worse.  You leak poop or have blood in your poop.  Your belly feels hard or bigger than normal (is bloated).  You have very bad belly pain.  You feel dizzy or you faint. This information is not intended to replace advice given to you by your health care provider. Make sure you discuss any questions you have with your health care provider. Document Released: 01/13/2008 Document Revised: 02/14/2016 Document Reviewed: 01/15/2016 Elsevier  Interactive Patient Education  2018 [employer]

## 2017-11-22 ENCOUNTER — Encounter: Payer: Self-pay | Admitting: Internal Medicine

## 2018-02-04 ENCOUNTER — Ambulatory Visit (INDEPENDENT_AMBULATORY_CARE_PROVIDER_SITE_OTHER): Payer: Self-pay | Admitting: Gastroenterology

## 2018-02-04 ENCOUNTER — Encounter: Payer: Self-pay | Admitting: Gastroenterology

## 2018-02-04 ENCOUNTER — Other Ambulatory Visit: Payer: Self-pay

## 2018-02-04 DIAGNOSIS — Z8371 Family history of colonic polyps: Secondary | ICD-10-CM

## 2018-02-04 DIAGNOSIS — K625 Hemorrhage of anus and rectum: Secondary | ICD-10-CM | POA: Insufficient documentation

## 2018-02-04 DIAGNOSIS — K5900 Constipation, unspecified: Secondary | ICD-10-CM | POA: Insufficient documentation

## 2018-02-04 NOTE — Assessment & Plan Note (Signed)
47 year old female with change in bowel habits with intermittent constipation and decreased stool caliber, intermittent toilet tissue hematochezia.  Increased water intake, fiber consumption as well as short course of MiraLAX has improved bowel frequency over the past 6 weeks.  Family history of colon polyp requiring partial colon resection, sister in her 2340s.  Patient needs to have a colonoscopy at this time.  I have discussed the risks, alternatives, benefits with regards to but not limited to the risk of reaction to medication, bleeding, infection, perforation and the patient is agreeable to proceed. Written consent to be obtained.  Currently bowel movements are regular.  She has been warned if she has recurrent constipation especially to 2 weeks prior to colonoscopy that this needs to be addressed.  She can utilize Linzess 145 mcg daily.  Samples provided.  Financial questions she is self-employed and uninsured.  Her questions have been directed to the office manager and [facility] assistance forms provided.

## 2018-02-04 NOTE — Patient Instructions (Addendum)
1. Colonoscopy as schedule. See separate instructions.  2. The week before your colonoscopy, make sure your bowel movements remain regular. If needed, you can take Linzess one capsule daily on empty stomach starting a week or so before your procedure. This will help make sure you are adequately cleaned out for your colonoscopy. Samples provided for Linzess today.

## 2018-02-04 NOTE — Progress Notes (Signed)
Primary Care Physician:  Johna SheriffVincent, Carol L, MD  Primary Gastroenterologist:  Jonette EvaSandi Fields, MD    Chief Complaint  Patient presents with  . Constipation    at times she gets so constipated when she has BM, it is very large and has hard time passing it. BM's are once daily or once QOD  . Consult    TCS. FH polyps    HPI:  Deborah PalingConnie Stivers is a 47 y.o. female here for further management of constipation.  Periods of constipation lasting for several days, occurring intermittently over the past few months.  Associated with toilet tissue hematochezia especially when passing large stool.  Noticed symptoms in 2017 after having her gallbladder removed but worse more frequently.   She saw PCP who advised Miralax daily.  She took this for couple of weeks, stools became too frequent and associated with abdominal discomfort.  She stopped the medication.  For the past 1-1/2 months her bowel movements have been occurring daily, describes small caliber stools.  She has been increasing her water consumption, approximately 32 ounces now.  Increased use.  Continues coffee, tea, [facility].  Tries to eat as many vegetables as possible.  With these changes her last severe bout of constipation was in April.  Sister had part of her colon removed 2 years ago for a large precancerous polyp, she was in her late 5540s.  According to her sister her gastroenterologist advised that all of her siblings needed a colonoscopy.  No family history of colon cancer.  Heartburn controlled with over-the-counter Prilosec.  No dysphagia.  No weight loss.   Current Outpatient Medications  Medication Sig Dispense Refill  . albuterol (PROVENTIL) (2.5 MG/3ML) 0.083% nebulizer solution Take 3 mLs (2.5 mg total) by nebulization every 6 (six) hours as needed for wheezing or shortness of breath. 75 mL 1  . omeprazole (PRILOSEC OTC) 20 MG tablet Take 20 mg by mouth daily.    . pravastatin (PRAVACHOL) 40 MG tablet Take 1 tablet (40 mg total) by  mouth daily. 90 tablet 4   No current facility-administered medications for this visit.     Allergies as of 02/04/2018  . (No Known Allergies)    Past Medical History:  Diagnosis Date  . Asthma   . GERD (gastroesophageal reflux disease)   . Hyperlipidemia     Past Surgical History:  Procedure Laterality Date  . CHOLECYSTECTOMY  2017  . TUBAL LIGATION      Family History  Problem Relation Age of Onset  . Arthritis Mother   . Cancer Mother        ???  . Diabetes Mother   . Vision loss Mother   . Arthritis Father   . Early death Father   . Heart disease Father   . Stroke Father   . Arthritis Maternal Grandmother   . Colon polyps Sister        Age 340s, large colon polyp requiring resection, advised to have her sibling complete colonoscopy  . Colon cancer Neg Hx     Social History   Socioeconomic History  . Marital status: Married    Spouse name: Not on file  . Number of children: Not on file  . Years of education: Not on file  . Highest education level: Not on file  Occupational History  . Not on file  Social Needs  . Financial resource strain: Not on file  . Food insecurity:    Worry: Not on file    Inability: Not on  file  . Transportation needs:    Medical: Not on file    Non-medical: Not on file  Tobacco Use  . Smoking status: Current Every Day Smoker    Packs/day: 2.00    Years: 20.00    Pack years: 40.00    Types: Cigarettes  . Smokeless tobacco: Never Used  Substance and Sexual Activity  . Alcohol use: No  . Drug use: No  . Sexual activity: Not on file  Lifestyle  . Physical activity:    Days per week: Not on file    Minutes per session: Not on file  . Stress: Not on file  Relationships  . Social connections:    Talks on phone: Not on file    Gets together: Not on file    Attends religious service: Not on file    Active member of club or organization: Not on file    Attends meetings of clubs or organizations: Not on file     Relationship status: Not on file  . Intimate partner violence:    Fear of current or ex partner: Not on file    Emotionally abused: Not on file    Physically abused: Not on file    Forced sexual activity: Not on file  Other Topics Concern  . Not on file  Social History Narrative  . Not on file      ROS:  General: Negative for anorexia, weight loss, fever, chills, fatigue, weakness. Eyes: Negative for vision changes.  ENT: Negative for hoarseness, difficulty swallowing , nasal congestion. CV: Negative for chest pain, angina, palpitations, dyspnea on exertion, peripheral edema.  Respiratory: Negative for dyspnea at rest, dyspnea on exertion, cough, sputum, wheezing.  GI: See history of present illness. GU:  Negative for dysuria, hematuria, urinary incontinence, urinary frequency, nocturnal urination.  MS: Negative for joint pain, low back pain.  Derm: Negative for rash or itching.  Neuro: Negative for weakness, abnormal sensation, seizure, frequent headaches, memory loss, confusion.  Psych: Negative for anxiety, depression, suicidal ideation, hallucinations.  Endo: Negative for unusual weight change.  Heme: Negative for bruising or bleeding. Allergy: Negative for rash or hives.    Physical Examination:  BP 125/78   Pulse 85   Temp 97.8 F (36.6 C) (Oral)   Ht 5\' 4"  (1.626 m)   Wt 135 lb (61.2 kg)   LMP 01/28/2018 (Approximate)   BMI 23.17 kg/m    General: Well-nourished, well-developed in no acute distress.  Head: Normocephalic, atraumatic.   Eyes: Conjunctiva pink, no icterus. Mouth: Oropharyngeal mucosa moist and pink , no lesions erythema or exudate. Neck: Supple without thyromegaly, masses, or lymphadenopathy.  Lungs: Clear to auscultation bilaterally.  Heart: Regular rate and rhythm, no murmurs rubs or gallops.  Abdomen: Bowel sounds are normal, nontender, nondistended, no hepatosplenomegaly or masses, no abdominal bruits or    hernia , no rebound or guarding.    Rectal: Not performed Extremities: No lower extremity edema. No clubbing or deformities.  Neuro: Alert and oriented x 4 , grossly normal neurologically.  Skin: Warm and dry, no rash or jaundice.   Psych: Alert and cooperative, normal mood and affect.    Imaging Studies: No results found.

## 2018-02-07 NOTE — Progress Notes (Signed)
cc'ed to pcp °

## 2018-02-14 ENCOUNTER — Telehealth: Payer: Self-pay

## 2018-02-14 DIAGNOSIS — K5900 Constipation, unspecified: Secondary | ICD-10-CM

## 2018-02-14 NOTE — Addendum Note (Signed)
Addended by: Margurite AuerbachOMPTON, KARLA G on: 02/14/2018 12:42 PM   Modules accepted: Orders

## 2018-02-14 NOTE — Telephone Encounter (Signed)
Wants a referral  Changed    (has not had a referral since April)

## 2018-02-14 NOTE — Telephone Encounter (Signed)
What referral? Had Gi referral for family h/o polyps, was told to get early colonoscopy. OK to put in again to GI specialist pt prefers.

## 2018-02-14 NOTE — Telephone Encounter (Signed)
Pt states she needed to see [facility] since they take her Care Credit Card and the other GI did not. New referral placed.

## 2018-02-15 ENCOUNTER — Telehealth: Payer: Self-pay

## 2018-02-15 NOTE — Telephone Encounter (Signed)
Noted  

## 2018-02-15 NOTE — Telephone Encounter (Signed)
Pt called office and LMOVM to cancel TCS for 03/07/18. [facility] is [facility] to [facility], they will accept her Care Credit. LMOVM and informed Endo Scheduler.

## 2018-03-07 ENCOUNTER — Ambulatory Visit (HOSPITAL_COMMUNITY): Admit: 2018-03-07 | Payer: Self-pay | Admitting: Gastroenterology

## 2018-03-07 ENCOUNTER — Encounter (HOSPITAL_COMMUNITY): Payer: Self-pay

## 2018-03-07 SURGERY — COLONOSCOPY
Anesthesia: Moderate Sedation

## 2018-05-28 ENCOUNTER — Ambulatory Visit (INDEPENDENT_AMBULATORY_CARE_PROVIDER_SITE_OTHER): Payer: Self-pay | Admitting: Family Medicine

## 2018-05-28 VITALS — BP 112/77 | HR 86 | Temp 97.7°F | Ht 64.0 in | Wt 137.8 lb

## 2018-05-28 DIAGNOSIS — J40 Bronchitis, not specified as acute or chronic: Secondary | ICD-10-CM

## 2018-05-28 DIAGNOSIS — J329 Chronic sinusitis, unspecified: Secondary | ICD-10-CM

## 2018-05-28 MED ORDER — BENZONATATE 200 MG PO CAPS: 200.0000 mg | ORAL_CAPSULE | Freq: Three times a day (TID) | ORAL | 0 refills | Status: DC | PRN

## 2018-05-28 MED ORDER — AMOXICILLIN-POT CLAVULANATE 875-125 MG PO TABS: 1.0000 | ORAL_TABLET | Freq: Two times a day (BID) | ORAL | 0 refills | Status: DC

## 2018-05-28 MED ORDER — PSEUDOEPHEDRINE-GUAIFENESIN ER 120-1200 MG PO TB12: 1.0000 | ORAL_TABLET | Freq: Two times a day (BID) | ORAL | 0 refills | Status: DC

## 2018-05-28 NOTE — Progress Notes (Signed)
Chief Complaint  Patient presents with  . Cough    Cold since Tuesday, Dyquil/Nyquil, feels worse in evenings. no known fever, husband sick before this was running fever took abx, productive cough little discoloration    HPI  Patient presents today for Patient presents with upper respiratory congestion. No rhinorrhea.  There is moderate sore throat. Patient reports coughing frequently as well. Some colored sputum noted. There is no fever, chills or sweats. The patient denies being short of breath. Onset was 5 days ago. Gradually worsening. Tried OTCs without improvement.  PMH: Smoking status noted ROS: Per HPI  Objective: BP 112/77   Pulse 86   Temp 97.7 F (36.5 C) (Oral)   Ht 5\' 4"  (1.626 m)   Wt 137 lb 12.8 oz (62.5 kg)   BMI 23.65 kg/m  Gen: NAD, alert, cooperative with exam HEENT: NCAT, Nasal passages swollen, red TMS normal CV: RRR, good S1/S2, no murmur Resp: Bronchitis changes with scattered wheezes, non-labored Ext: No edema, warm Neuro: Alert and oriented, No gross deficits  Assessment and plan:  1. Sinobronchitis     Meds ordered this encounter  Medications  . amoxicillin-clavulanate (AUGMENTIN) 875-125 MG tablet    Sig: Take 1 tablet by mouth 2 (two) times daily. Take all of this medication    Dispense:  20 tablet    Refill:  0  . benzonatate (TESSALON) 200 MG capsule    Sig: Take 1 capsule (200 mg total) by mouth 3 (three) times daily as needed for cough.    Dispense:  20 capsule    Refill:  0  . Pseudoephedrine-Guaifenesin [id number] MG TB12    Sig: Take 1 tablet by mouth 2 (two) times daily. For congestion    Dispense:  20 each    Refill:  0    Follow up as needed.  Mechele Claude, MD

## 2018-09-04 ENCOUNTER — Other Ambulatory Visit: Payer: Self-pay | Admitting: Pediatrics

## 2018-09-04 DIAGNOSIS — E785 Hyperlipidemia, unspecified: Secondary | ICD-10-CM

## 2018-09-05 NOTE — Telephone Encounter (Signed)
PCP Dr Oswaldo Done  Last saw Dr Darlyn Read 05/28/18   Last lipid 04/11/15

## 2018-09-23 ENCOUNTER — Other Ambulatory Visit: Payer: Self-pay

## 2018-09-26 ENCOUNTER — Encounter: Payer: Self-pay | Admitting: Family Medicine

## 2018-09-26 ENCOUNTER — Ambulatory Visit (INDEPENDENT_AMBULATORY_CARE_PROVIDER_SITE_OTHER): Payer: Self-pay | Admitting: Family Medicine

## 2018-09-26 VITALS — BP 138/79 | HR 70 | Temp 97.7°F | Ht 64.0 in | Wt 145.0 lb

## 2018-09-26 DIAGNOSIS — J4520 Mild intermittent asthma, uncomplicated: Secondary | ICD-10-CM

## 2018-09-26 DIAGNOSIS — J45909 Unspecified asthma, uncomplicated: Secondary | ICD-10-CM | POA: Insufficient documentation

## 2018-09-26 DIAGNOSIS — E785 Hyperlipidemia, unspecified: Secondary | ICD-10-CM

## 2018-09-26 DIAGNOSIS — K219 Gastro-esophageal reflux disease without esophagitis: Secondary | ICD-10-CM

## 2018-09-26 MED ORDER — PRAVASTATIN SODIUM 40 MG PO TABS
40.0000 mg | ORAL_TABLET | Freq: Every day | ORAL | 4 refills | Status: DC
Start: 2018-09-26 — End: 2018-09-27

## 2018-09-26 MED ORDER — ALBUTEROL SULFATE 108 (90 BASE) MCG/ACT IN AEPB: 1.0000 | INHALATION_SPRAY | Freq: Four times a day (QID) | RESPIRATORY_TRACT | 3 refills | Status: AC | PRN

## 2018-09-26 NOTE — Patient Instructions (Signed)

## 2018-09-26 NOTE — Progress Notes (Signed)
Subjective:  Patient ID: Deborah Osborne, female    DOB: Dec 19, 1970, 48 y.o.   MRN: [id number]  Chief Complaint:  Medical Management of Chronic Issues   HPI: Deborah Osborne is a 48 y.o. female presenting on 09/26/2018 for Medical Management of Chronic Issues   1. Hyperlipidemia, unspecified hyperlipidemia type  Compliant with medications. Denies associated side effects. States she does try to watch her diet and exercise on a regular basis.    2. Gastroesophageal reflux disease without esophagitis  Well controlled with OTC Prilosec. States she does have some reflux if she does not take her medications. She denies sore throat, voice changes, cough, or dysphagia. No hemoptysis or abdominal pain. No changes in stool color.    3. Mild intermittent asthma without complication  Well controlled. States she has not had to use her nebulizer in several months. She denies exacerbations or night time awakenings.      Relevant past medical, surgical, family, and social history reviewed and updated as indicated.  Allergies and medications reviewed and updated.   Past Medical History:  Diagnosis Date  . Asthma   . Colon polyps 01/16/2016  . GERD (gastroesophageal reflux disease)   . Hyperlipidemia     Past Surgical History:  Procedure Laterality Date  . CHOLECYSTECTOMY  2017  . TUBAL LIGATION      Social History   Socioeconomic History  . Marital status: Married    Spouse name: Not on file  . Number of children: Not on file  . Years of education: Not on file  . Highest education level: Not on file  Occupational History  . Not on file  Social Needs  . Financial resource strain: Not on file  . Food insecurity:    Worry: Not on file    Inability: Not on file  . Transportation needs:    Medical: Not on file    Non-medical: Not on file  Tobacco Use  . Smoking status: Current Every Day Smoker    Packs/day: 1.50    Years: 20.00    Pack years: 30.00    Types: Cigarettes  .  Smokeless tobacco: Never Used  Substance and Sexual Activity  . Alcohol use: No  . Drug use: No  . Sexual activity: Not on file  Lifestyle  . Physical activity:    Days per week: Not on file    Minutes per session: Not on file  . Stress: Not on file  Relationships  . Social connections:    Talks on phone: Not on file    Gets together: Not on file    Attends religious service: Not on file    Active member of club or organization: Not on file    Attends meetings of clubs or organizations: Not on file    Relationship status: Not on file  . Intimate partner violence:    Fear of current or ex partner: Not on file    Emotionally abused: Not on file    Physically abused: Not on file    Forced sexual activity: Not on file  Other Topics Concern  . Not on file  Social History Narrative  . Not on file    Outpatient Encounter Medications as of 09/26/2018  Medication Sig  . albuterol (PROVENTIL) (2.5 MG/3ML) 0.083% nebulizer solution Take 3 mLs (2.5 mg total) by nebulization every 6 (six) hours as needed for wheezing or shortness of breath.  Marland Kitchen omeprazole (PRILOSEC OTC) 20 MG tablet Take 20 mg by mouth  daily.  . [DISCONTINUED] pravastatin (PRAVACHOL) 40 MG tablet Take 1 tablet (40 mg total) by mouth daily.  . Albuterol Sulfate (PROAIR RESPICLICK) 950 (90 Base) MCG/ACT AEPB Inhale 1-2 puffs into the lungs 4 (four) times daily as needed (shob, wheezing). prn  . pravastatin (PRAVACHOL) 40 MG tablet Take 1 tablet (40 mg total) by mouth daily.   No facility-administered encounter medications on file as of 09/26/2018.     No Known Allergies  Review of Systems  Constitutional: Negative for activity change, appetite change, chills, fatigue, fever and unexpected weight change.  HENT: Negative for sore throat, trouble swallowing and voice change.   Eyes: Negative for photophobia and visual disturbance.  Respiratory: Negative for apnea, cough, choking, chest tightness, shortness of breath,  wheezing and stridor.   Cardiovascular: Negative for chest pain, palpitations and leg swelling.  Gastrointestinal: Negative for abdominal pain, anal bleeding and blood in stool.  Endocrine: Negative for cold intolerance, heat intolerance, polydipsia, polyphagia and polyuria.  Musculoskeletal: Positive for arthralgias (minimal). Negative for myalgias.  Neurological: Negative for weakness and headaches.  Psychiatric/Behavioral: Negative for confusion.  All other systems reviewed and are negative.       Objective:  BP 138/79   Pulse 70   Temp 97.7 F (36.5 C) (Oral)   Ht 5' 4"  (1.626 m)   Wt 145 lb (65.8 kg)   BMI 24.89 kg/m    Wt Readings from Last 3 Encounters:  09/26/18 145 lb (65.8 kg)  05/28/18 137 lb 12.8 oz (62.5 kg)  02/04/18 135 lb (61.2 kg)    Physical Exam Vitals signs and nursing note reviewed.  Constitutional:      General: She is not in acute distress.    Appearance: Normal appearance. She is well-developed and well-groomed. She is not ill-appearing or toxic-appearing.  HENT:     Head: Normocephalic and atraumatic.     Right Ear: Hearing, tympanic membrane, ear canal and external ear normal.     Left Ear: Hearing, tympanic membrane, ear canal and external ear normal.     Nose: Nose normal.     Mouth/Throat:     Lips: Pink.     Mouth: Mucous membranes are moist.     Pharynx: Oropharynx is clear.  Eyes:     General: Lids are normal.     Extraocular Movements: Extraocular movements intact.     Conjunctiva/sclera: Conjunctivae normal.     Pupils: Pupils are equal, round, and reactive to light.  Neck:     Musculoskeletal: Normal range of motion and neck supple. No neck rigidity or muscular tenderness.     Thyroid: No thyroid mass, thyromegaly or thyroid tenderness.     Vascular: No carotid bruit or JVD.  Cardiovascular:     Rate and Rhythm: Normal rate and regular rhythm.     Chest Wall: PMI is not displaced.     Pulses: Normal pulses.     Heart sounds:  Normal heart sounds. No murmur. No friction rub. No gallop.   Pulmonary:     Effort: Pulmonary effort is normal.     Breath sounds: Normal breath sounds. No wheezing.  Abdominal:     General: Abdomen is flat. Bowel sounds are normal. There is no distension.     Tenderness: There is no abdominal tenderness. There is no right CVA tenderness or left CVA tenderness.  Musculoskeletal: Normal range of motion.     Right lower leg: No edema.     Left lower leg: No edema.  Lymphadenopathy:  Cervical: No cervical adenopathy.  Skin:    General: Skin is warm and dry.     Capillary Refill: Capillary refill takes less than 2 seconds.     Coloration: Skin is not jaundiced.     Findings: No rash.  Neurological:     General: No focal deficit present.     Mental Status: She is alert and oriented to person, place, and time.     Cranial Nerves: No cranial nerve deficit.     Sensory: No sensory deficit.     Motor: No weakness.     Coordination: Coordination normal.     Gait: Gait normal.     Deep Tendon Reflexes: Reflexes normal.  Psychiatric:        Mood and Affect: Mood normal.        Behavior: Behavior normal. Behavior is cooperative.        Thought Content: Thought content normal.        Judgment: Judgment normal.     Results for orders placed or performed in visit on 07/15/15  Anaerobic and Aerobic Culture  Result Value Ref Range   Anaerobic Culture Final report    Result 1 Comment    Aerobic Culture Final report (A)    Result 1 Staphylococcus aureus (A)    Antimicrobial Susceptibility Comment        Pertinent labs & imaging results that were available during my care of the patient were reviewed by me and considered in my medical decision making.  Assessment & Plan:  Deborah Osborne was seen today for medical management of chronic issues.  Diagnoses and all orders for this visit:  Hyperlipidemia, unspecified hyperlipidemia type Diet and exercise encouraged. Labs pending. Continue  medications as prescribed.  -     pravastatin (PRAVACHOL) 40 MG tablet; Take 1 tablet (40 mg total) by mouth daily. -     Lipid panel -     CMP14+EGFR  Gastroesophageal reflux disease without esophagitis Well controlled. Continue OTC Prilosec. Report any new or worsening symptoms.   Mild intermittent asthma without complication SABA prescribed today. Report any new or worsening symptoms.  -     Albuterol Sulfate (PROAIR RESPICLICK) 665 (90 Base) MCG/ACT AEPB; Inhale 1-2 puffs into the lungs 4 (four) times daily as needed (shob, wheezing). prn     Continue all other maintenance medications.  Follow up plan: Return in about 6 months (around 03/27/2019), or if symptoms worsen or fail to improve.  Educational handout given for GERD  The above assessment and management plan was discussed with the patient. The patient verbalized understanding of and has agreed to the management plan. Patient is aware to call the clinic if symptoms persist or worsen. Patient is aware when to return to the clinic for a follow-up visit. Patient educated on when it is appropriate to go to the emergency department.   Monia Pouch, FNP-C [facility] [phone]

## 2018-09-27 LAB — CMP14+EGFR
ALBUMIN: 4.4 g/dL (ref 3.8–4.8)
ALK PHOS: 67 IU/L (ref 39–117)
ALT: 8 IU/L (ref 0–32)
AST: 17 IU/L (ref 0–40)
Albumin/Globulin Ratio: 1.8 (ref 1.2–2.2)
BILIRUBIN TOTAL: 0.2 mg/dL (ref 0.0–1.2)
BUN / CREAT RATIO: 9 (ref 9–23)
BUN: 9 mg/dL (ref 6–24)
CHLORIDE: 100 mmol/L (ref 96–106)
CO2: 22 mmol/L (ref 20–29)
Calcium: 9.6 mg/dL (ref 8.7–10.2)
Creatinine, Ser: 0.98 mg/dL (ref 0.57–1.00)
GFR calc Af Amer: 80 mL/min/{1.73_m2} (ref >59)
GFR calc non Af Amer: 69 mL/min/{1.73_m2} (ref >59)
GLUCOSE: 96 mg/dL (ref 65–99)
Globulin, Total: 2.4 g/dL (ref 1.5–4.5)
Potassium: 5 mmol/L (ref 3.5–5.2)
Sodium: 139 mmol/L (ref 134–144)
Total Protein: 6.8 g/dL (ref 6.0–8.5)

## 2018-09-27 LAB — LIPID PANEL
Chol/HDL Ratio: 6 ratio — ABNORMAL HIGH (ref 0.0–4.4)
Cholesterol, Total: 227 mg/dL — ABNORMAL HIGH (ref 100–199)
HDL: 38 mg/dL — ABNORMAL LOW (ref >39)
LDL Calculated: 148 mg/dL — ABNORMAL HIGH (ref 0–99)
Triglycerides: 207 mg/dL — ABNORMAL HIGH (ref 0–149)
VLDL CHOLESTEROL CAL: 41 mg/dL — AB (ref 5–40)

## 2018-09-27 MED ORDER — PRAVASTATIN SODIUM 80 MG PO TABS: 80.0000 mg | ORAL_TABLET | Freq: Every day | ORAL | 3 refills | Status: DC

## 2018-09-27 NOTE — Addendum Note (Signed)
Addended by: Sonny Masters on: 09/27/2018 08:06 AM   Modules accepted: Orders

## 2018-12-14 ENCOUNTER — Telehealth: Payer: Self-pay | Admitting: Family Medicine

## 2018-12-19 ENCOUNTER — Encounter (INDEPENDENT_AMBULATORY_CARE_PROVIDER_SITE_OTHER): Payer: Self-pay

## 2018-12-19 ENCOUNTER — Telehealth: Payer: Self-pay | Admitting: Family Medicine

## 2019-12-18 ENCOUNTER — Ambulatory Visit (INDEPENDENT_AMBULATORY_CARE_PROVIDER_SITE_OTHER): Payer: Self-pay | Admitting: Nurse Practitioner

## 2019-12-18 ENCOUNTER — Other Ambulatory Visit: Payer: Self-pay

## 2019-12-18 ENCOUNTER — Encounter: Payer: Self-pay | Admitting: Nurse Practitioner

## 2019-12-18 VITALS — BP 114/65 | HR 75 | Temp 97.8°F | Ht 64.0 in | Wt 149.0 lb

## 2019-12-18 DIAGNOSIS — E782 Mixed hyperlipidemia: Secondary | ICD-10-CM

## 2019-12-18 DIAGNOSIS — K219 Gastro-esophageal reflux disease without esophagitis: Secondary | ICD-10-CM

## 2019-12-18 DIAGNOSIS — E785 Hyperlipidemia, unspecified: Secondary | ICD-10-CM

## 2019-12-18 MED ORDER — PRAVASTATIN SODIUM 80 MG PO TABS: 80.0000 mg | ORAL_TABLET | Freq: Every day | ORAL | 3 refills | Status: DC

## 2019-12-18 NOTE — Assessment & Plan Note (Signed)
Well controlled on current medication. Continue diet and exercise, education provided. Follow up in 6 months.

## 2019-12-18 NOTE — Progress Notes (Signed)
Established Patient Office Visit  Subjective:  Patient ID: Deborah Osborne, female    DOB: 1970/09/20  Age: 49 y.o. MRN: [id number]  CC:  Chief Complaint  Patient presents with  . Medical Management of Chronic Issues    HPI Deborah Osborne  presents with hyperlipidemia. Patient was diagnosed in 2016. Compliance with treatment has been good; The patient is compliant with current medications pravastatin 80 mg daily.  She maintains a low cholesterol diet, follows up as directed, and maintains an exercise regimen . The patient denies experiencing any hypercholesterolemia related symptoms.    Concerning patients GERD, patient is compliant with treatment and is currently on omeprazole 20 mg daily. She is compliant with current medication. No side effects or worsening symptoms. Medication is taken as prescribed and patient follows up as directed.       Past Medical History:  Diagnosis Date  . Asthma   . Colon polyps 01/16/2016  . GERD (gastroesophageal reflux disease)   . Hyperlipidemia     Past Surgical History:  Procedure Laterality Date  . CHOLECYSTECTOMY  2017  . TUBAL LIGATION      Family History  Problem Relation Age of Onset  . Arthritis Mother   . Cancer Mother        ???  . Diabetes Mother   . Vision loss Mother   . Arthritis Father   . Early death Father   . Heart disease Father   . Stroke Father   . Arthritis Maternal Grandmother   . Colon polyps Sister        Age 4s, large colon polyp requiring resection, advised to have her sibling complete colonoscopy  . Colon cancer Neg Hx     Social History   Socioeconomic History  . Marital status: Married    Spouse name: Not on file  . Number of children: Not on file  . Years of education: Not on file  . Highest education level: Not on file  Occupational History  . Not on file  Tobacco Use  . Smoking status: Current Every Day Smoker    Packs/day: 1.50    Years: 20.00    Pack years: 30.00    Types:  Cigarettes  . Smokeless tobacco: Never Used  Substance and Sexual Activity  . Alcohol use: No  . Drug use: No  . Sexual activity: Not on file  Other Topics Concern  . Not on file  Social History Narrative  . Not on file   Social Determinants of Health   Financial Resource Strain:   . Difficulty of Paying Living Expenses:   Food Insecurity:   . Worried About Charity fundraiser in the Last Year:   . Arboriculturist in the Last Year:   Transportation Needs:   . Film/video editor (Medical):   Marland Kitchen Lack of Transportation (Non-Medical):   Physical Activity:   . Days of Exercise per Week:   . Minutes of Exercise per Session:   Stress:   . Feeling of Stress :   Social Connections:   . Frequency of Communication with Friends and Family:   . Frequency of Social Gatherings with Friends and Family:   . Attends Religious Services:   . Active Member of Clubs or Organizations:   . Attends Archivist Meetings:   Marland Kitchen Marital Status:   Intimate Partner Violence:   . Fear of Current or Ex-Partner:   . Emotionally Abused:   Marland Kitchen Physically Abused:   . Sexually  Abused:     Outpatient Medications Prior to Visit  Medication Sig Dispense Refill  . albuterol (PROVENTIL) (2.5 MG/3ML) 0.083% nebulizer solution Take 3 mLs (2.5 mg total) by nebulization every 6 (six) hours as needed for wheezing or shortness of breath. 75 mL 1  . Albuterol Sulfate (PROAIR RESPICLICK) 409 (90 Base) MCG/ACT AEPB Inhale 1-2 puffs into the lungs 4 (four) times daily as needed (shob, wheezing). prn 1 each 3  . Cyanocobalamin (VITAMIN B-12 PO) Take by mouth.    . Multiple Vitamins-Minerals (CENTRUM ADULTS PO) Take by mouth.    Marland Kitchen omeprazole (PRILOSEC OTC) 20 MG tablet Take 20 mg by mouth daily.    . pravastatin (PRAVACHOL) 80 MG tablet Take 1 tablet (80 mg total) by mouth daily. 90 tablet 3   No facility-administered medications prior to visit.    No Known Allergies  ROS Review of Systems  Constitutional:  Negative for activity change and appetite change.  HENT: Negative for congestion.   Respiratory: Negative for chest tightness and shortness of breath.   Cardiovascular: Negative for palpitations and leg swelling.  Genitourinary: Negative for difficulty urinating.  Musculoskeletal: Negative for arthralgias and myalgias.  Skin: Negative for pallor and rash.  Neurological: Negative for light-headedness and headaches.  Psychiatric/Behavioral: Negative.       Objective:    Physical Exam  Constitutional: She is oriented to person, place, and time. She appears well-developed and well-nourished.  HENT:  Head: Normocephalic.  Mouth/Throat: Oropharynx is clear and moist.  Eyes: Conjunctivae are normal.  Cardiovascular: Normal rate and normal heart sounds.  Pulmonary/Chest: Breath sounds normal.  Abdominal: Bowel sounds are normal. She exhibits no distension. There is no abdominal tenderness.  Musculoskeletal:        General: No tenderness.     Cervical back: Neck supple.  Neurological: She is oriented to person, place, and time.  Skin: Skin is warm. No rash noted.  Psychiatric: She has a normal mood and affect. Her behavior is normal.    BP 114/65   Pulse 75   Temp 97.8 F (36.6 C) (Temporal)   Ht 5' 4" (1.626 m)   Wt 149 lb (67.6 kg)   BMI 25.58 kg/m  Wt Readings from Last 3 Encounters:  12/18/19 149 lb (67.6 kg)  09/26/18 145 lb (65.8 kg)  05/28/18 137 lb 12.8 oz (62.5 kg)     Health Maintenance Due  Topic Date Due  . HIV Screening  Never done  . PAP SMEAR-Modifier  04/10/2018    There are no preventive care reminders to display for this patient.  No results found for: TSH No results found for: WBC, HGB, HCT, MCV, PLT Lab Results  Component Value Date   NA 139 09/26/2018   K 5.0 09/26/2018   CO2 22 09/26/2018   GLUCOSE 96 09/26/2018   BUN 9 09/26/2018   CREATININE 0.98 09/26/2018   BILITOT 0.2 09/26/2018   ALKPHOS 67 09/26/2018   AST 17 09/26/2018   ALT 8  09/26/2018   PROT 6.8 09/26/2018   ALBUMIN 4.4 09/26/2018   CALCIUM 9.6 09/26/2018   Lab Results  Component Value Date   CHOL 227 (H) 09/26/2018   Lab Results  Component Value Date   HDL 38 (L) 09/26/2018   Lab Results  Component Value Date   LDLCALC 148 (H) 09/26/2018   Lab Results  Component Value Date   TRIG 207 (H) 09/26/2018   Lab Results  Component Value Date   CHOLHDL 6.0 (H) 09/26/2018  No results found for: HGBA1C    Assessment & Plan:   Problem List Items Addressed This Visit      Digestive   GERD (gastroesophageal reflux disease)    Well controlled on current medication. Continue diet and exercise, education provided. Follow up in 6 months.         Other   Mixed hyperlipidemia - Primary    Well controlled, Patient has not had lipids checked or any blood work in 1 year. CBC, CMP and lipids collected. Provided eduction on diet and exeresis, smoking cessation. Medication adjustments pending lab results.  Refill sent to pharmacy. Patient will follow up in 6 months.        Relevant Medications   pravastatin (PRAVACHOL) 80 MG tablet   Other Relevant Orders   Lipid panel   CMP14+EGFR   CBC with Differential    Other Visit Diagnoses    Hyperlipidemia, unspecified hyperlipidemia type       Relevant Medications   pravastatin (PRAVACHOL) 80 MG tablet      Meds ordered this encounter  Medications  . pravastatin (PRAVACHOL) 80 MG tablet    Sig: Take 1 tablet (80 mg total) by mouth daily.    Dispense:  90 tablet    Refill:  3    Order Specific Question:   Supervising Provider    Answer:   Caryl Pina A [[id number]]    Follow-up: Return in about 6 months (around 06/19/2020) for hyperlipidemia.    Ivy Lynn, NP

## 2019-12-18 NOTE — Assessment & Plan Note (Signed)
Well controlled, Patient has not had lipids checked or any blood work in 1 year. CBC, CMP and lipids collected. Provided eduction on diet and exeresis, smoking cessation. Medication adjustments pending lab results.  Refill sent to pharmacy. Patient will follow up in 6 months.

## 2019-12-18 NOTE — Patient Instructions (Addendum)
Well controlled, Patient has not had lipids checked or any blood work in 1 year. CBC, CMP and lipids collected. Provided eduction on diet and exeresis, smoking cessation. Medication adjustments pending lab results.  Refill sent to pharmacy. Patient will follow up in 6 months.   Well controlled on current medication. Continue diet and exercise, education provided. Follow up in 6 months.    Gastroesophageal Reflux Disease, Adult Gastroesophageal reflux (GER) happens when acid from the stomach flows up into the tube that connects the mouth and the stomach (esophagus). Normally, food travels down the esophagus and stays in the stomach to be digested. With GER, food and stomach acid sometimes move back up into the esophagus. You may have a disease called gastroesophageal reflux disease (GERD) if the reflux:  Happens often.  Causes frequent or very bad symptoms.  Causes problems such as damage to the esophagus. When this happens, the esophagus becomes sore and swollen (inflamed). Over time, GERD can make small holes (ulcers) in the lining of the esophagus. What are the causes? This condition is caused by a problem with the muscle between the esophagus and the stomach. When this muscle is weak or not normal, it does not close properly to keep food and acid from coming back up from the stomach. The muscle can be weak because of:  Tobacco use.  Pregnancy.  Having a certain type of hernia (hiatal hernia).  Alcohol use.  Certain foods and drinks, such as coffee, chocolate, onions, and peppermint. What increases the risk? You are more likely to develop this condition if you:  Are overweight.  Have a disease that affects your connective tissue.  Use NSAID medicines. What are the signs or symptoms? Symptoms of this condition include:  Heartburn.  Difficult or painful swallowing.  The feeling of having a lump in the throat.  A bitter taste in the mouth.  Bad breath.  Having a lot of  saliva.  Having an upset or bloated stomach.  Belching.  Chest pain. Different conditions can cause chest pain. Make sure you see your doctor if you have chest pain.  Shortness of breath or noisy breathing (wheezing).  Ongoing (chronic) cough or a cough at night.  Wearing away of the surface of teeth (tooth enamel).  Weight loss. How is this treated? Treatment will depend on how bad your symptoms are. Your doctor may suggest:  Changes to your diet.  Medicine.  Surgery. Follow these instructions at home: Eating and drinking   Follow a diet as told by your doctor. You may need to avoid foods and drinks such as: ? Coffee and tea (with or without caffeine). ? Drinks that contain alcohol. ? Energy drinks and sports drinks. ? Bubbly (carbonated) drinks or sodas. ? Chocolate and cocoa. ? Peppermint and mint flavorings. ? Garlic and onions. ? Horseradish. ? Spicy and acidic foods. These include peppers, chili powder, curry powder, vinegar, hot sauces, and BBQ sauce. ? Citrus fruit juices and citrus fruits, such as oranges, lemons, and limes. ? Tomato-based foods. These include red sauce, chili, salsa, and pizza with red sauce. ? Fried and fatty foods. These include donuts, french fries, potato chips, and high-fat dressings. ? High-fat meats. These include hot dogs, rib eye steak, sausage, ham, and bacon. ? High-fat dairy items, such as whole milk, butter, and cream cheese.  Eat small meals often. Avoid eating large meals.  Avoid drinking large amounts of liquid with your meals.  Avoid eating meals during the 2-3 hours before bedtime.  Avoid lying down right after you eat.  Do not exercise right after you eat. Lifestyle   Do not use any products that contain nicotine or tobacco. These include cigarettes, e-cigarettes, and chewing tobacco. If you need help quitting, ask your doctor.  Try to lower your stress. If you need help doing this, ask your doctor.  If you are  overweight, lose an amount of weight that is healthy for you. Ask your doctor about a safe weight loss goal. General instructions  Pay attention to any changes in your symptoms.  Take over-the-counter and prescription medicines only as told by your doctor. Do not take aspirin, ibuprofen, or other NSAIDs unless your doctor says it is okay.  Wear loose clothes. Do not wear anything tight around your waist.  Raise (elevate) the head of your bed about 6 inches (15 cm).  Avoid bending over if this makes your symptoms worse.  Keep all follow-up visits as told by your doctor. This is important. Contact a doctor if:  You have new symptoms.  You lose weight and you do not know why.  You have trouble swallowing or it hurts to swallow.  You have wheezing or a cough that keeps happening.  Your symptoms do not get better with treatment.  You have a hoarse voice. Get help right away if:  You have pain in your arms, neck, jaw, teeth, or back.  You feel sweaty, dizzy, or light-headed.  You have chest pain or shortness of breath.  You throw up (vomit) and your throw-up looks like blood or coffee grounds.  You pass out (faint).  Your poop (stool) is bloody or black.  You cannot swallow, drink, or eat. Summary  If a person has gastroesophageal reflux disease (GERD), food and stomach acid move back up into the esophagus and cause symptoms or problems such as damage to the esophagus.  Treatment will depend on how bad your symptoms are.  Follow a diet as told by your doctor.  Take all medicines only as told by your doctor. This information is not intended to replace advice given to you by your health care provider. Make sure you discuss any questions you have with your health care provider. Document Revised: 02/02/2018 Document Reviewed: 02/02/2018 Elsevier Patient Education  2020 [employer]. Smoking Tobacco Information, Adult Smoking tobacco can be harmful to your health. Tobacco  contains a poisonous (toxic), colorless chemical called nicotine. Nicotine is addictive. It changes the brain and can make it hard to stop smoking. Tobacco also has other toxic chemicals that can hurt your body and raise your risk of many cancers. How can smoking tobacco affect me? Smoking tobacco puts you at risk for:  Cancer. Smoking is most commonly associated with lung cancer, but can also lead to cancer in other parts of the body.  Chronic obstructive pulmonary disease (COPD). This is a long-term lung condition that makes it hard to breathe. It also gets worse over time.  High blood pressure (hypertension), heart disease, stroke, or heart attack.  Lung infections, such as pneumonia.  Cataracts. This is when the lenses in the eyes become clouded.  Digestive problems. This may include peptic ulcers, heartburn, and gastroesophageal reflux disease (GERD).  Oral health problems, such as gum disease and tooth loss.  Loss of taste and smell. Smoking can affect your appearance by causing:  Wrinkles.  Yellow or stained teeth, fingers, and fingernails. Smoking tobacco can also affect your social life, because:  It may be challenging to find places  to smoke when away from home. Many workplaces, [employer], hotels, and public places are tobacco-free.  Smoking is expensive. This is due to the cost of tobacco and the long-term costs of treating health problems from smoking.  Secondhand smoke may affect those around you. Secondhand smoke can cause lung cancer, breathing problems, and heart disease. Children of smokers have a higher risk for: ? Sudden infant death syndrome (SIDS). ? Ear infections. ? Lung infections. If you currently smoke tobacco, quitting now can help you:  Lead a longer and healthier life.  Look, smell, breathe, and feel better over time.  Save money.  Protect others from the harms of secondhand smoke. What actions can I take to prevent health problems? Quit  smoking   Do not start smoking. Quit if you already do.  Make a plan to quit smoking and commit to it. Look for programs to help you and ask your health care provider for recommendations and ideas.  Set a date and write down all the reasons you want to quit.  Let your friends and family know you are quitting so they can help and support you. Consider finding friends who also want to quit. It can be easier to quit with someone else, so that you can support each other.  Talk with your health care provider about using nicotine replacement medicines to help you quit, such as gum, lozenges, patches, sprays, or pills.  Do not replace cigarette smoking with electronic cigarettes, which are commonly called e-cigarettes. The safety of e-cigarettes is not known, and some may contain harmful chemicals.  If you try to quit but return to smoking, stay positive. It is common to slip up when you first quit, so take it one day at a time.  Be prepared for cravings. When you feel the urge to smoke, chew gum or suck on hard candy. Lifestyle  Stay busy and take care of your body.  Drink enough fluid to keep your urine pale yellow.  Get plenty of exercise and eat a healthy diet. This can help prevent weight gain after quitting.  Monitor your eating habits. Quitting smoking can cause you to have a larger appetite than when you smoke.  Find ways to relax. Go out with friends or family to a movie or a restaurant where people do not smoke.  Ask your health care provider about having regular tests (screenings) to check for cancer. This may include blood tests, imaging tests, and other tests.  Find ways to manage your stress, such as meditation, yoga, or exercise. Where to find support To get support to quit smoking, consider:  Asking your health care provider for more information and resources.  Taking classes to learn more about quitting smoking.  Looking for local organizations that offer resources  about quitting smoking.  Joining a support group for people who want to quit smoking in your local community.  Calling the smokefree.gov counselor helpline: 1-800-Quit-Now 773-459-7707) Where to find more information You may find more information about quitting smoking from:  HelpGuide.org: www.helpguide.org  https://hall.com/: smokefree.gov  American Lung Association: www.lung.org Contact a health care provider if you:  Have problems breathing.  Notice that your lips, nose, or fingers turn blue.  Have chest pain.  Are coughing up blood.  Feel faint or you pass out.  Have other health changes that cause you to worry. Summary  Smoking tobacco can negatively affect your health, the health of those around you, your finances, and your social life.  Do not start  smoking. Quit if you already do. If you need help quitting, ask your health care provider.  Think about joining a support group for people who want to quit smoking in your local community. There are many effective programs that will help you to quit this behavior. This information is not intended to replace advice given to you by your health care provider. Make sure you discuss any questions you have with your health care provider. Document Revised: 04/21/2019 Document Reviewed: 08/11/2016 Elsevier Patient Education  2020 [employer] High Cholesterol  High cholesterol is a condition in which the blood has high levels of a white, waxy, fat-like substance (cholesterol). The human body needs small amounts of cholesterol. The liver makes all the cholesterol that the body needs. Extra (excess) cholesterol comes from the food that we eat. Cholesterol is carried from the liver by the blood through the blood vessels. If you have high cholesterol, deposits (plaques) may build up on the walls of your blood vessels (arteries). Plaques make the arteries narrower and stiffer. Cholesterol plaques increase your risk for heart attack and  stroke. Work with your health care provider to keep your cholesterol levels in a healthy range. What increases the risk? This condition is more likely to develop in people who:  Eat foods that are high in animal fat (saturated fat) or cholesterol.  Are overweight.  Are not getting enough exercise.  Have a family history of high cholesterol. What are the signs or symptoms? There are no symptoms of this condition. How is this diagnosed? This condition may be diagnosed from the results of a blood test.  If you are older than age 7, your health care provider may check your cholesterol every 4-6 years.  You may be checked more often if you already have high cholesterol or other risk factors for heart disease. The blood test for cholesterol measures:  "Bad" cholesterol (LDL cholesterol). This is the main type of cholesterol that causes heart disease. The desired level for LDL is less than 100.  "Good" cholesterol (HDL cholesterol). This type helps to protect against heart disease by cleaning the arteries and carrying the LDL away. The desired level for HDL is 60 or higher.  Triglycerides. These are fats that the body can store or burn for energy. The desired number for triglycerides is lower than 150.  Total cholesterol. This is a measure of the total amount of cholesterol in your blood, including LDL cholesterol, HDL cholesterol, and triglycerides. A healthy number is less than 200. How is this treated? This condition is treated with diet changes, lifestyle changes, and medicines. Diet changes  This may include eating more whole grains, fruits, vegetables, nuts, and fish.  This may also include cutting back on red meat and foods that have a lot of added sugar. Lifestyle changes  Changes may include getting at least 40 minutes of aerobic exercise 3 times a week. Aerobic exercises include walking, biking, and swimming. Aerobic exercise along with a healthy diet can help you maintain a  healthy weight.  Changes may also include quitting smoking. Medicines  Medicines are usually given if diet and lifestyle changes have failed to reduce your cholesterol to healthy levels.  Your health care provider may prescribe a statin medicine. Statin medicines have been shown to reduce cholesterol, which can reduce the risk of heart disease. Follow these instructions at home: Eating and drinking If told by your health care provider:  Eat chicken (without skin), fish, veal, shellfish, ground [place] breast, and round  or loin cuts of red meat.  Do not eat fried foods or fatty meats, such as hot dogs and salami.  Eat plenty of fruits, such as apples.  Eat plenty of vegetables, such as broccoli, potatoes, and carrots.  Eat beans, peas, and lentils.  Eat grains such as barley, rice, couscous, and bulgur wheat.  Eat pasta without cream sauces.  Use skim or nonfat milk, and eat low-fat or nonfat yogurt and cheeses.  Do not eat or drink whole milk, cream, ice cream, egg yolks, or hard cheeses.  Do not eat stick margarine or tub margarines that contain trans fats (also called partially hydrogenated oils).  Do not eat saturated tropical oils, such as coconut oil and palm oil.  Do not eat cakes, cookies, crackers, or other baked goods that contain trans fats.  General instructions  Exercise as directed by your health care provider. Increase your activity level with activities such as gardening, walking, and taking the stairs.  Take over-the-counter and prescription medicines only as told by your health care provider.  Do not use any products that contain nicotine or tobacco, such as cigarettes and e-cigarettes. If you need help quitting, ask your health care provider.  Keep all follow-up visits as told by your health care provider. This is important. Contact a health care provider if:  You are struggling to maintain a healthy diet or weight.  You need help to start on an  exercise program.  You need help to stop smoking. Get help right away if:  You have chest pain.  You have trouble breathing. This information is not intended to replace advice given to you by your health care provider. Make sure you discuss any questions you have with your health care provider. Document Revised: 07/30/2017 Document Reviewed: 01/25/2016 Elsevier Patient Education  2020 [employer].

## 2019-12-19 ENCOUNTER — Other Ambulatory Visit: Payer: Self-pay | Admitting: Nurse Practitioner

## 2019-12-19 LAB — CBC WITH DIFFERENTIAL/PLATELET
Basophils Absolute: 0 10*3/uL (ref 0.0–0.2)
Basos: 0 %
EOS (ABSOLUTE): 0.1 10*3/uL (ref 0.0–0.4)
Eos: 1 %
Hematocrit: 44.1 % (ref 34.0–46.6)
Hemoglobin: 14.7 g/dL (ref 11.1–15.9)
Immature Grans (Abs): 0 10*3/uL (ref 0.0–0.1)
Immature Granulocytes: 0 %
Lymphocytes Absolute: 3.3 10*3/uL — ABNORMAL HIGH (ref 0.7–3.1)
Lymphs: 34 %
MCH: 30.5 pg (ref 26.6–33.0)
MCHC: 33.3 g/dL (ref 31.5–35.7)
MCV: 92 fL (ref 79–97)
Monocytes Absolute: 0.8 10*3/uL (ref 0.1–0.9)
Monocytes: 8 %
Neutrophils Absolute: 5.6 10*3/uL (ref 1.4–7.0)
Neutrophils: 57 %
Platelets: 315 10*3/uL (ref 150–450)
RBC: 4.82 x10E6/uL (ref 3.77–5.28)
RDW: 13.9 % (ref 11.7–15.4)
WBC: 9.9 10*3/uL (ref 3.4–10.8)

## 2019-12-19 LAB — LIPID PANEL
Chol/HDL Ratio: 6.4 ratio — ABNORMAL HIGH (ref 0.0–4.4)
Cholesterol, Total: 223 mg/dL — ABNORMAL HIGH (ref 100–199)
HDL: 35 mg/dL — ABNORMAL LOW (ref >39)
LDL Chol Calc (NIH): 124 mg/dL — ABNORMAL HIGH (ref 0–99)
Triglycerides: 362 mg/dL — ABNORMAL HIGH (ref 0–149)
VLDL Cholesterol Cal: 64 mg/dL — ABNORMAL HIGH (ref 5–40)

## 2019-12-19 LAB — CMP14+EGFR
ALT: 10 IU/L (ref 0–32)
AST: 15 IU/L (ref 0–40)
Albumin/Globulin Ratio: 1.4 (ref 1.2–2.2)
Albumin: 4.5 g/dL (ref 3.8–4.8)
Alkaline Phosphatase: 87 IU/L (ref 39–117)
BUN/Creatinine Ratio: 11 (ref 9–23)
BUN: 10 mg/dL (ref 6–24)
Bilirubin Total: <0.2 mg/dL (ref 0.0–1.2)
CO2: 25 mmol/L (ref 20–29)
Calcium: 10.1 mg/dL (ref 8.7–10.2)
Chloride: 97 mmol/L (ref 96–106)
Creatinine, Ser: 0.9 mg/dL (ref 0.57–1.00)
GFR calc Af Amer: 87 mL/min/{1.73_m2} (ref >59)
GFR calc non Af Amer: 76 mL/min/{1.73_m2} (ref >59)
Globulin, Total: 3.2 g/dL (ref 1.5–4.5)
Glucose: 71 mg/dL (ref 65–99)
Potassium: 4.9 mmol/L (ref 3.5–5.2)
Sodium: 139 mmol/L (ref 134–144)
Total Protein: 7.7 g/dL (ref 6.0–8.5)

## 2019-12-19 MED ORDER — ICOSAPENT ETHYL 1 G PO CAPS: 2.0000 g | ORAL_CAPSULE | Freq: Two times a day (BID) | ORAL | 0 refills | Status: DC

## 2019-12-21 ENCOUNTER — Telehealth: Payer: Self-pay | Admitting: Nurse Practitioner

## 2019-12-21 MED ORDER — FENOFIBRATE 145 MG PO TABS: 145.0000 mg | ORAL_TABLET | Freq: Every day | ORAL | 5 refills | Status: DC

## 2019-12-21 NOTE — Telephone Encounter (Signed)
Pt aware that new med was called in

## 2019-12-21 NOTE — Telephone Encounter (Signed)
Pt called stating that she went to the pharmacy to pick up Rx for Vacepa and says even with a coupon it was still going to cost her $366. Wants to know if there is something cheaper that MMM can prescribe her.

## 2019-12-21 NOTE — Telephone Encounter (Signed)
changed vascepa to tricor- prescription sent to pharmacy

## 2020-03-21 ENCOUNTER — Encounter: Payer: Self-pay | Admitting: Nurse Practitioner

## 2020-03-21 ENCOUNTER — Ambulatory Visit (INDEPENDENT_AMBULATORY_CARE_PROVIDER_SITE_OTHER): Payer: Self-pay | Admitting: Nurse Practitioner

## 2020-03-21 ENCOUNTER — Other Ambulatory Visit: Payer: Self-pay

## 2020-03-21 VITALS — BP 128/80 | HR 68 | Temp 98.0°F | Resp 20 | Ht 64.0 in | Wt 150.0 lb

## 2020-03-21 DIAGNOSIS — E782 Mixed hyperlipidemia: Secondary | ICD-10-CM

## 2020-03-21 DIAGNOSIS — Z6825 Body mass index (BMI) 25.0-25.9, adult: Secondary | ICD-10-CM | POA: Insufficient documentation

## 2020-03-21 DIAGNOSIS — J4520 Mild intermittent asthma, uncomplicated: Secondary | ICD-10-CM

## 2020-03-21 DIAGNOSIS — Z720 Tobacco use: Secondary | ICD-10-CM

## 2020-03-21 DIAGNOSIS — K219 Gastro-esophageal reflux disease without esophagitis: Secondary | ICD-10-CM

## 2020-03-21 NOTE — Patient Instructions (Signed)
Steps to Quit Smoking Smoking tobacco is the leading cause of preventable death. It can affect almost every organ in the body. Smoking puts you and people around you at risk for many serious, long-lasting (chronic) diseases. Quitting smoking can be hard, but it is one of the best things that you can do for your health. It is never too late to quit. How do I get ready to quit? When you decide to quit smoking, make a plan to help you succeed. Before you quit:  Pick a date to quit. Set a date within the next 2 weeks to give you time to prepare.  Write down the reasons why you are quitting. Keep this list in places where you will see it often.  Tell your family, friends, and co-workers that you are quitting. Their support is important.  Talk with your doctor about the choices that may help you quit.  Find out if your health insurance will pay for these treatments.  Know the people, places, things, and activities that make you want to smoke (triggers). Avoid them. What first steps can I take to quit smoking?  Throw away all cigarettes at home, at work, and in your car.  Throw away the things that you use when you smoke, such as ashtrays and lighters.  Clean your car. Make sure to empty the ashtray.  Clean your home, including curtains and carpets. What can I do to help me quit smoking? Talk with your doctor about taking medicines and seeing a counselor at the same time. You are more likely to succeed when you do both.  If you are pregnant or breastfeeding, talk with your doctor about counseling or other ways to quit smoking. Do not take medicine to help you quit smoking unless your doctor tells you to do so. To quit smoking: Quit right away  Quit smoking totally, instead of slowly cutting back on how much you smoke over a period of time.  Go to counseling. You are more likely to quit if you go to counseling sessions regularly. Take medicine You may take medicines to help you quit. Some  medicines need a prescription, and some you can buy over-the-counter. Some medicines may contain a drug called nicotine to replace the nicotine in cigarettes. Medicines may:  Help you to stop having the desire to smoke (cravings).  Help to stop the problems that come when you stop smoking (withdrawal symptoms). Your doctor may ask you to use:  Nicotine patches, gum, or lozenges.  Nicotine inhalers or sprays.  Non-nicotine medicine that is taken by mouth. Find resources Find resources and other ways to help you quit smoking and remain smoke-free after you quit. These resources are most helpful when you use them often. They include:  Online chats with a counselor.  Phone quitlines.  Printed self-help materials.  Support groups or group counseling.  Text messaging programs.  Mobile phone apps. Use apps on your mobile phone or tablet that can help you stick to your quit plan. There are many free apps for mobile phones and tablets as well as websites. Examples include Quit Guide from the CDC and smokefree.gov  What things can I do to make it easier to quit?   Talk to your family and friends. Ask them to support and encourage you.  Call a phone quitline (1-800-QUIT-NOW), reach out to support groups, or work with a counselor.  Ask people who smoke to not smoke around you.  Avoid places that make you want to smoke,   such as: ? Bars. ? Parties. ? Smoke-break areas at work.  Spend time with people who do not smoke.  Lower the stress in your life. Stress can make you want to smoke. Try these things to help your stress: ? Getting regular exercise. ? Doing deep-breathing exercises. ? Doing yoga. ? Meditating. ? Doing a body scan. To do this, close your eyes, focus on one area of your body at a time from head to toe. Notice which parts of your body are tense. Try to relax the muscles in those areas. How will I feel when I quit smoking? Day 1 to 3 weeks Within the first 24 hours,  you may start to have some problems that come from quitting tobacco. These problems are very bad 2-3 days after you quit, but they do not often last for more than 2-3 weeks. You may get these symptoms:  Mood swings.  Feeling restless, nervous, angry, or annoyed.  Trouble concentrating.  Dizziness.  Strong desire for high-sugar foods and nicotine.  Weight gain.  Trouble pooping (constipation).  Feeling like you may vomit (nausea).  Coughing or a sore throat.  Changes in how the medicines that you take for other issues work in your body.  Depression.  Trouble sleeping (insomnia). Week 3 and afterward After the first 2-3 weeks of quitting, you may start to notice more positive results, such as:  Better sense of smell and taste.  Less coughing and sore throat.  Slower heart rate.  Lower blood pressure.  Clearer skin.  Better breathing.  Fewer sick days. Quitting smoking can be hard. Do not give up if you fail the first time. Some people need to try a few times before they succeed. Do your best to stick to your quit plan, and talk with your doctor if you have any questions or concerns. Summary  Smoking tobacco is the leading cause of preventable death. Quitting smoking can be hard, but it is one of the best things that you can do for your health.  When you decide to quit smoking, make a plan to help you succeed.  Quit smoking right away, not slowly over a period of time.  When you start quitting, seek help from your doctor, family, or friends. This information is not intended to replace advice given to you by your health care provider. Make sure you discuss any questions you have with your health care provider. Document Revised: 04/21/2019 Document Reviewed: 10/15/2018 Elsevier Patient Education  2020 Elsevier Inc.  

## 2020-03-21 NOTE — Progress Notes (Signed)
Subjective:    Patient ID: Deborah Osborne, female    DOB: [birth year], 49 y.o.   MRN: [id number]   Chief Complaint: Medical Management of Chronic Issues    HPI:  1. Mild intermittent asthma without complication ]is doing well. The only inhaler she has is proair and she does not use that very often. Has not had to sue it in several months. Denies ab=ny cough, sob or wheezing.  2. Gastroesophageal reflux disease without esophagitis She is on omeprazole daily. If she does not take it she will develop abdominal pain.  3. Mixed hyperlipidemia She is doing better on her diet. She bicycles 3x a week. . She is on pravastatin and fenofibrate. Lab Results  Component Value Date   CHOL 223 (H) 12/18/2019   HDL 35 (L) 12/18/2019   LDLCALC 124 (H) 12/18/2019   TRIG 362 (H) 12/18/2019   CHOLHDL 6.4 (H) 12/18/2019     4. Tobacco use Smokes over a pack 1/2 a day.   5. BMI 25.0-25.9,adult Weight is up 5 lbs Wt Readings from Last 3 Encounters:  03/21/20 150 lb (68 kg)  12/18/19 149 lb (67.6 kg)  09/26/18 145 lb (65.8 kg)    BMI Readings from Last 3 Encounters:  03/21/20 25.75 kg/m  12/18/19 25.58 kg/m  09/26/18 24.89 kg/m     Outpatient Encounter Medications as of 03/21/2020  Medication Sig  . albuterol (PROVENTIL) (2.5 MG/3ML) 0.083% nebulizer solution Take 3 mLs (2.5 mg total) by nebulization every 6 (six) hours as needed for wheezing or shortness of breath.  . Albuterol Sulfate (PROAIR RESPICLICK) 093 (90 Base) MCG/ACT AEPB Inhale 1-2 puffs into the lungs 4 (four) times daily as needed (shob, wheezing). prn  . Cyanocobalamin (VITAMIN B-12 PO) Take by mouth.  . fenofibrate (TRICOR) 145 MG tablet Take 1 tablet (145 mg total) by mouth daily.  . Multiple Vitamins-Minerals (CENTRUM ADULTS PO) Take by mouth.  Marland Kitchen omeprazole (PRILOSEC OTC) 20 MG tablet Take 20 mg by mouth daily.  . pravastatin (PRAVACHOL) 80 MG tablet Take 1 tablet (80 mg total) by mouth daily.   No  facility-administered encounter medications on file as of 03/21/2020.    Past Surgical History:  Procedure Laterality Date  . CHOLECYSTECTOMY  2017  . TUBAL LIGATION      Family History  Problem Relation Age of Onset  . Arthritis Mother   . Cancer Mother        ???  . Diabetes Mother   . Vision loss Mother   . Arthritis Father   . Early death Father   . Heart disease Father   . Stroke Father   . Arthritis Maternal Grandmother   . Colon polyps Sister        Age 19s, large colon polyp requiring resection, advised to have her sibling complete colonoscopy  . Colon cancer Neg Hx     New complaints: None today  Social history: Lives with her husband. She is a Curator and travels Financial trader: n/a    Review of Systems  Constitutional: Negative for diaphoresis.  Eyes: Negative for pain.  Respiratory: Negative for shortness of breath.   Cardiovascular: Negative for chest pain, palpitations and leg swelling.  Gastrointestinal: Negative for abdominal pain.  Endocrine: Negative for polydipsia.  Skin: Negative for rash.  Neurological: Negative for dizziness, weakness and headaches.  Hematological: Does not bruise/bleed easily.  All other systems reviewed and are negative.      Objective:   Physical Exam Vitals  and nursing note reviewed.  Constitutional:      General: She is not in acute distress.    Appearance: Normal appearance. She is well-developed.  HENT:     Head: Normocephalic.     Nose: Nose normal.  Eyes:     Pupils: Pupils are equal, round, and reactive to light.  Neck:     Vascular: No carotid bruit or JVD.  Cardiovascular:     Rate and Rhythm: Normal rate and regular rhythm.     Heart sounds: Normal heart sounds.  Pulmonary:     Effort: Pulmonary effort is normal. No respiratory distress.     Breath sounds: Normal breath sounds. No wheezing or rales.  Chest:     Chest wall: No tenderness.  Abdominal:     General: Bowel sounds  are normal. There is no distension or abdominal bruit.     Palpations: Abdomen is soft. There is no hepatomegaly, splenomegaly, mass or pulsatile mass.     Tenderness: There is no abdominal tenderness.  Musculoskeletal:        General: Normal range of motion.     Cervical back: Normal range of motion and neck supple.  Lymphadenopathy:     Cervical: No cervical adenopathy.  Skin:    General: Skin is warm and dry.  Neurological:     Mental Status: She is alert and oriented to person, place, and time.     Deep Tendon Reflexes: Reflexes are normal and symmetric.  Psychiatric:        Behavior: Behavior normal.        Thought Content: Thought content normal.        Judgment: Judgment normal.     BP 128/80   Pulse 68   Temp 98 F (36.7 C) (Temporal)   Resp 20   Ht _0  (1.626 m)   Wt 150 lb (68 kg)   SpO2 96%   BMI 25.75 kg/m        Assessment & Plan:  Cadence Davisson comes in today with chief complaint of Medical Management of Chronic Issues   Diagnosis and orders addressed:  1. Mild intermittent asthma without complication  2. Gastroesophageal reflux disease without esophagitis Avoid spicy foods Do not eat 2 hours prior to bedtime  3. Mixed hyperlipidemia Low fat diet - CMP14+EGFR - Lipid panel  4. Tobacco use Smoking cessation encouraged  5. BMI 25.0-25.9,adult Discussed diet and exercise for person with BMI >25 Will recheck weight in 3-6 months   Labs pending Health Maintenance reviewed Diet and exercise encouraged  Follow up plan: 6 months   Mary-Margaret Hassell Done, FNP

## 2020-03-22 LAB — LIPID PANEL
Chol/HDL Ratio: 5.4 ratio — ABNORMAL HIGH (ref 0.0–4.4)
Cholesterol, Total: 162 mg/dL (ref 100–199)
HDL: 30 mg/dL — ABNORMAL LOW (ref >39)
LDL Chol Calc (NIH): 91 mg/dL (ref 0–99)
Triglycerides: 239 mg/dL — ABNORMAL HIGH (ref 0–149)
VLDL Cholesterol Cal: 41 mg/dL — ABNORMAL HIGH (ref 5–40)

## 2020-03-22 LAB — CMP14+EGFR
ALT: 10 IU/L (ref 0–32)
AST: 15 IU/L (ref 0–40)
Albumin/Globulin Ratio: 1.8 (ref 1.2–2.2)
Albumin: 4.4 g/dL (ref 3.8–4.8)
Alkaline Phosphatase: 52 IU/L (ref 48–121)
BUN/Creatinine Ratio: 10 (ref 9–23)
BUN: 13 mg/dL (ref 6–24)
Bilirubin Total: <0.2 mg/dL (ref 0.0–1.2)
CO2: 25 mmol/L (ref 20–29)
Calcium: 9.7 mg/dL (ref 8.7–10.2)
Chloride: 102 mmol/L (ref 96–106)
Creatinine, Ser: 1.24 mg/dL — ABNORMAL HIGH (ref 0.57–1.00)
GFR calc Af Amer: 59 mL/min/{1.73_m2} — ABNORMAL LOW (ref >59)
GFR calc non Af Amer: 51 mL/min/{1.73_m2} — ABNORMAL LOW (ref >59)
Globulin, Total: 2.5 g/dL (ref 1.5–4.5)
Glucose: 85 mg/dL (ref 65–99)
Potassium: 5.4 mmol/L — ABNORMAL HIGH (ref 3.5–5.2)
Sodium: 137 mmol/L (ref 134–144)
Total Protein: 6.9 g/dL (ref 6.0–8.5)

## 2020-03-28 ENCOUNTER — Telehealth: Payer: Self-pay | Admitting: Nurse Practitioner

## 2020-03-28 NOTE — Telephone Encounter (Signed)
Pt returned missed call from [facility] regarding lab results. Reviewed results with pt per MMM notes. Pt voiced understanding.

## 2020-06-15 ENCOUNTER — Other Ambulatory Visit: Payer: Self-pay | Admitting: Nurse Practitioner

## 2020-06-24 ENCOUNTER — Other Ambulatory Visit: Payer: Self-pay

## 2020-06-24 ENCOUNTER — Ambulatory Visit: Payer: Self-pay

## 2020-06-24 ENCOUNTER — Ambulatory Visit (INDEPENDENT_AMBULATORY_CARE_PROVIDER_SITE_OTHER): Payer: Self-pay | Admitting: Family Medicine

## 2020-06-24 ENCOUNTER — Ambulatory Visit (INDEPENDENT_AMBULATORY_CARE_PROVIDER_SITE_OTHER): Payer: Self-pay

## 2020-06-24 ENCOUNTER — Encounter: Payer: Self-pay | Admitting: Family Medicine

## 2020-06-24 VITALS — BP 122/71 | HR 75 | Temp 97.7°F | Ht 64.0 in | Wt 155.0 lb

## 2020-06-24 DIAGNOSIS — S6992XA Unspecified injury of left wrist, hand and finger(s), initial encounter: Secondary | ICD-10-CM

## 2020-06-24 DIAGNOSIS — S62637A Displaced fracture of distal phalanx of left little finger, initial encounter for closed fracture: Secondary | ICD-10-CM

## 2020-06-24 DIAGNOSIS — Z23 Encounter for immunization: Secondary | ICD-10-CM

## 2020-06-24 NOTE — Patient Instructions (Signed)
Finger Fracture, Adult A finger fracture is a break in any of the finger bones. What are the causes? The main cause of finger fractures is injury, such as from sports, a fall, or closing a drawer or door. What increases the risk? The following factors may make you more likely to develop this condition:  Playing sports.  Workplace activities that involve machinery.  Osteoporosis. This condition makes your bones less dense and causes them to break easily. What are the signs or symptoms? The main symptoms of a fractured finger are pain, bruising, and swelling shortly after the injury. Other symptoms include:  Stiffness.  Exposed bones (compound fracture or open fracture), in severe cases. How is this diagnosed? This condition is diagnosed based on a physical exam, your medical history, and your symptoms. An X-ray will also be done to confirm the diagnosis. How is this treated? Treatment for this condition depends on the severity of the fracture. If the bones are still in place, the finger may be splinted to keep the finger still while it heals (immobilization). If the bones are out of place, your health care provider may move them back into place by hand (manually) or surgically. You may also need to do exercises to regain strength and flexibility (physical therapy) in your finger. Follow these instructions at home: If you have a splint:   Wear the splint as told by your health care provider. Remove it only as told by your health care provider.  Do not put pressure on any part of the splint until it is fully hardened. This may take several hours.  Loosen the splint if your fingers tingle, become numb, or turn cold and blue.  Keep the splint clean.  If the splint is not waterproof: ? Do not let it get wet. ? Cover it with a watertight covering when you take a bath or a shower.  Ask your health care provider when it is safe for you to drive. Managing pain, stiffness, and  swelling  If directed, put ice on the injured area: ? If you have a removable splint, remove it as told by your health care provider. ? Put ice in a plastic bag. ? Place a towel between your skin and the bag. ? Leave the ice on for 20 minutes, 2-3 times a day.  Move your fingers often to avoid stiffness and to lessen swelling.  Raise (elevate) the injured area above the level of your heart while you are sitting or lying down. Activity  Do not drive or use heavy machinery while taking prescription pain medicine.  Do physical therapy exercises as told by your health care provider.  Return to your normal activities as told by your health care provider. Ask your health care provider what activities are safe for you. General instructions  Do not use any products that contain nicotine or tobacco, such as cigarettes and e-cigarettes. These can delay bone healing. If you need help quitting, ask your health care provider.  Take over-the-counter and prescription medicines only as told by your health care provider.  Keep all follow-up visits as told by your health care provider. This is important. Contact a health care provider if:  Your pain or swelling gets worse, even with treatment.  You have trouble moving your finger. Get help right away if:  Your finger becomes numb or blue. Summary  A finger fracture is a break in any of the finger bones.  Injury is the main cause of finger fractures.  Treatment  for this condition depends on the severity of the fracture. This information is not intended to replace advice given to you by your health care provider. Make sure you discuss any questions you have with your health care provider. Document Revised: 11/22/2018 Document Reviewed: 03/10/2017 Elsevier Patient Education  2020 Elsevier Inc.  

## 2020-06-24 NOTE — Progress Notes (Signed)
Subjective: CC: left little finger pain PCP: Chevis Pretty, FNP  [id number] Deborah Osborne is a 49 y.o. female presenting to clinic today for:  1. Pain left little finger Deborah Osborne repots pain in her left little finger after an injury 3 days ago. She was pulling up her pants when her hand slipped and the top of her left pinky got pulled. She reports some swelling of the joint. She reports that the pain is mild and really only hurts it is jarred. She has been taking tylenol for the pain with good relief. She is able to move and bend her little finger but is unable to straighten it out. She denies numbness or tingling.   Relevant past medical, surgical, family, and social history reviewed and updated as indicated.  Allergies and medications reviewed and updated.  No Known Allergies Past Medical History:  Diagnosis Date  . Asthma   . Colon polyps 01/16/2016  . GERD (gastroesophageal reflux disease)   . Hyperlipidemia     Current Outpatient Medications:  .  albuterol (PROVENTIL) (2.5 MG/3ML) 0.083% nebulizer solution, Take 3 mLs (2.5 mg total) by nebulization every 6 (six) hours as needed for wheezing or shortness of breath., Disp: 75 mL, Rfl: 1 .  Albuterol Sulfate (PROAIR RESPICLICK) 552 (90 Base) MCG/ACT AEPB, Inhale 1-2 puffs into the lungs 4 (four) times daily as needed (shob, wheezing). prn, Disp: 1 each, Rfl: 3 .  Cyanocobalamin (VITAMIN B-12 PO), Take by mouth., Disp: , Rfl:  .  fenofibrate (TRICOR) 145 MG tablet, Take 1 tablet by mouth once daily, Disp: 30 tablet, Rfl: 2 .  Multiple Vitamins-Minerals (CENTRUM ADULTS PO), Take by mouth., Disp: , Rfl:  .  omeprazole (PRILOSEC OTC) 20 MG tablet, Take 20 mg by mouth daily., Disp: , Rfl:  .  pravastatin (PRAVACHOL) 80 MG tablet, Take 1 tablet (80 mg total) by mouth daily., Disp: 90 tablet, Rfl: 3 Social History   Socioeconomic History  . Marital status: Married    Spouse name: Not on file  . Number of children: Not on file  .  Years of education: Not on file  . Highest education level: Not on file  Occupational History  . Not on file  Tobacco Use  . Smoking status: Current Every Day Smoker    Packs/day: 1.50    Years: 20.00    Pack years: 30.00    Types: Cigarettes  . Smokeless tobacco: Never Used  Vaping Use  . Vaping Use: Never used  Substance and Sexual Activity  . Alcohol use: No  . Drug use: No  . Sexual activity: Not on file  Other Topics Concern  . Not on file  Social History Narrative  . Not on file   Social Determinants of Health   Financial Resource Strain:   . Difficulty of Paying Living Expenses: Not on file  Food Insecurity:   . Worried About Charity fundraiser in the Last Year: Not on file  . Ran Out of Food in the Last Year: Not on file  Transportation Needs:   . Lack of Transportation (Medical): Not on file  . Lack of Transportation (Non-Medical): Not on file  Physical Activity:   . Days of Exercise per Week: Not on file  . Minutes of Exercise per Session: Not on file  Stress:   . Feeling of Stress : Not on file  Social Connections:   . Frequency of Communication with Friends and Family: Not on file  . Frequency of Social Gatherings with  Friends and Family: Not on file  . Attends Religious Services: Not on file  . Active Member of Clubs or Organizations: Not on file  . Attends Archivist Meetings: Not on file  . Marital Status: Not on file  Intimate Partner Violence:   . Fear of Current or Ex-Partner: Not on file  . Emotionally Abused: Not on file  . Physically Abused: Not on file  . Sexually Abused: Not on file   Family History  Problem Relation Age of Onset  . Arthritis Mother   . Cancer Mother        ???  . Diabetes Mother   . Vision loss Mother   . Arthritis Father   . Early death Father   . Heart disease Father   . Stroke Father   . Arthritis Maternal Grandmother   . Colon polyps Sister        Age 67s, large colon polyp requiring resection,  advised to have her sibling complete colonoscopy  . Colon cancer Neg Hx     Review of Systems  Per HPI.   Objective: Office vital signs reviewed. BP 122/71   Pulse 75   Temp 97.7 F (36.5 C) (Temporal)   Ht 5' 4"  (1.626 m)   Wt 155 lb (70.3 kg)   BMI 26.61 kg/m   Physical Examination:  Physical Exam Vitals and nursing note reviewed.  Constitutional:      General: She is not in acute distress.    Appearance: Normal appearance. She is not ill-appearing or toxic-appearing.  Cardiovascular:     Rate and Rhythm: Normal rate and regular rhythm.     Heart sounds: Normal heart sounds. No murmur heard.   Pulmonary:     Effort: Pulmonary effort is normal. No respiratory distress.     Breath sounds: Normal breath sounds.  Musculoskeletal:     Comments: Mild swelling to DIP joint of left little finer. No bruising, erythema, or TTP. Full ROM however patient is unable to actively extend at DIP joint. Passive extension at DIP joint is possible.   Skin:    General: Skin is warm and dry.  Neurological:     General: No focal deficit present.     Mental Status: She is alert and oriented to person, place, and time.  Psychiatric:        Mood and Affect: Mood normal.        Behavior: Behavior normal.      Results for orders placed or performed in visit on 03/21/20  CMP14+EGFR  Result Value Ref Range   Glucose 85 65 - 99 mg/dL   BUN 13 6 - 24 mg/dL   Creatinine, Ser 1.24 (H) 0.57 - 1.00 mg/dL   GFR calc non Af Amer 51 (L) >59 mL/min/1.73   GFR calc Af Amer 59 (L) >59 mL/min/1.73   BUN/Creatinine Ratio 10 9 - 23   Sodium 137 134 - 144 mmol/L   Potassium 5.4 (H) 3.5 - 5.2 mmol/L   Chloride 102 96 - 106 mmol/L   CO2 25 20 - 29 mmol/L   Calcium 9.7 8.7 - 10.2 mg/dL   Total Protein 6.9 6.0 - 8.5 g/dL   Albumin 4.4 3.8 - 4.8 g/dL   Globulin, Total 2.5 1.5 - 4.5 g/dL   Albumin/Globulin Ratio 1.8 1.2 - 2.2   Bilirubin Total <0.2 0.0 - 1.2 mg/dL   Alkaline Phosphatase 52 48 - 121 IU/L     AST 15 0 - 40 IU/L  ALT 10 0 - 32 IU/L  Lipid panel  Result Value Ref Range   Cholesterol, Total 162 100 - 199 mg/dL   Triglycerides 239 (H) 0 - 149 mg/dL   HDL 30 (L) >39 mg/dL   VLDL Cholesterol Cal 41 (H) 5 - 40 mg/dL   LDL Chol Calc (NIH) 91 0 - 99 mg/dL   Chol/HDL Ratio 5.4 (H) 0.0 - 4.4 ratio     Assessment/ Plan: Briasia was seen today for hand pain.  Diagnoses and all orders for this visit:  Finger injury, left, initial encounter Fracture noted on Xray today in office.  -     DG Finger Little Left; Future -     Ambulatory referral to Orthopedic Surgery  Closed displaced fracture of distal phalanx of left little finger, initial encounter Finger splinted in office. Referral to ortho. Continue tylenol. Patient needs to avoid Nsaids for chronic health conditions.  -     Ambulatory referral to Orthopedic Surgery  Need for immunization against influenza Flu vaccine today in office.  -     Flu Vaccine QUAD 36+ mos IM  Follow up as needed.    The above assessment and management plan was discussed with the patient. The patient verbalized understanding of and has agreed to the management plan. Patient is aware to call the clinic if symptoms persist or worsen. Patient is aware when to return to the clinic for a follow-up visit. Patient educated on when it is appropriate to go to the emergency department.   Marjorie Smolder, FNP-C [facility] [address] [place], [place] [address] [phone]

## 2020-06-26 ENCOUNTER — Ambulatory Visit (INDEPENDENT_AMBULATORY_CARE_PROVIDER_SITE_OTHER): Payer: Self-pay | Admitting: Orthopaedic Surgery

## 2020-06-26 ENCOUNTER — Other Ambulatory Visit: Payer: Self-pay

## 2020-06-26 ENCOUNTER — Encounter: Payer: Self-pay | Admitting: Orthopaedic Surgery

## 2020-06-26 DIAGNOSIS — M79645 Pain in left finger(s): Secondary | ICD-10-CM | POA: Insufficient documentation

## 2020-06-26 NOTE — Progress Notes (Signed)
Office Visit Note   Patient: Deborah Osborne           Date of Birth: 03/04/1971           MRN: [id number] Visit Date: 06/26/2020              Requested by: Bennie Pierini, FNP [address] [place],  [place] [address] PCP: Bennie Pierini, FNP   Assessment & Plan: Visit Diagnoses:  1. Pain in left finger(s)     Plan:Deborah Osborne injured her left little finger 5 days ago when she was trying to pull jeans on top of another pair of pants.  She felt something "pop".  She was eventually seen by her primary care physician 3 days later and x-rays were obtained demonstrating "fracture".  She was placed in a splint and visits the office today for follow-up.  She is right-hand dominant.  I reviewed her x-rays without evidence of a fracture but she does have a mallet finger with incomplete extension of the DIP joint.  Long discussion regarding treatment options.  I think the best approach would be splinting with a finger stack splint for at least 6 weeks.  She is aware that she still may have incomplete extension but with surgery to straighten the finger she may end up losing flexion and think that would be more of a loss.  Follow-Up Instructions: Return in about 1 month (around 07/26/2020).   Orders:  No orders of the defined types were placed in this encounter.  No orders of the defined types were placed in this encounter.     Procedures: No procedures performed   Clinical Data: No additional findings.   Subjective: Chief Complaint  Patient presents with  . Left Hand - Pain, Injury    DOI 06/21/2020  Patient presents today for her left pinky finger. She said that she was pulling on her jeans on Friday 06/21/2020 and felt something pop in her finger. She went to her PCP on 06/24/2020 and had x-rays taken. She was told that she fractured her pinky and was placed in a finger splint. She is right hand dominant. She is taking Tylenol as needed.  I reviewed her x-rays and  did not see any evidence of a fracture.  HPI  Review of Systems   Objective: Vital Signs: Ht 5\' 4"  (1.626 m)   Wt 155 lb (70.3 kg)   BMI 26.61 kg/m   Physical Exam  Ortho Exam awake alert and oriented x3.  Comfortable sitting.  On examination of her left nondominant hand she is able to make a full fist.  There was no swelling of the little finger but a little bit of tenderness across the dorsum of the DIP joint.  She had incomplete extension lacking about 15 degrees of full extension but had full flexion.  Neurologically intact.  No deformity. Specialty Comments:  No specialty comments available.  Imaging: No results found.   PMFS History: Patient Active Problem List   Diagnosis Date Noted  . Pain in left finger(s) 06/26/2020  . BMI 25.0-25.9,adult 03/21/2020  . GERD (gastroesophageal reflux disease) 09/26/2018  . Asthma 09/26/2018  . Constipation 02/04/2018  . Rectal bleeding 02/04/2018  . FH: colon polyps 02/04/2018  . Stress at home 05/21/2016  . S/P cholecystectomy 05/21/2016  . Tobacco use 05/21/2016  . Mixed hyperlipidemia 05/21/2016   Past Medical History:  Diagnosis Date  . Asthma   . Colon polyps 01/16/2016  . GERD (gastroesophageal reflux disease)   .  Hyperlipidemia     Family History  Problem Relation Age of Onset  . Arthritis Mother   . Cancer Mother        ???  . Diabetes Mother   . Vision loss Mother   . Arthritis Father   . Early death Father   . Heart disease Father   . Stroke Father   . Arthritis Maternal Grandmother   . Colon polyps Sister        Age 13s, large colon polyp requiring resection, advised to have her sibling complete colonoscopy  . Colon cancer Neg Hx     Past Surgical History:  Procedure Laterality Date  . CHOLECYSTECTOMY  2017  . TUBAL LIGATION     Social History   Occupational History  . Not on file  Tobacco Use  . Smoking status: Current Every Day Smoker    Packs/day: 1.50    Years: 20.00    Pack years: 30.00     Types: Cigarettes  . Smokeless tobacco: Never Used  Vaping Use  . Vaping Use: Never used  Substance and Sexual Activity  . Alcohol use: No  . Drug use: No  . Sexual activity: Not on file

## 2020-07-24 ENCOUNTER — Encounter: Payer: Self-pay | Admitting: Orthopaedic Surgery

## 2020-07-24 ENCOUNTER — Other Ambulatory Visit: Payer: Self-pay

## 2020-07-24 ENCOUNTER — Ambulatory Visit (INDEPENDENT_AMBULATORY_CARE_PROVIDER_SITE_OTHER): Payer: Self-pay | Admitting: Orthopaedic Surgery

## 2020-07-24 DIAGNOSIS — M20019 Mallet finger of unspecified finger(s): Secondary | ICD-10-CM

## 2020-07-24 NOTE — Progress Notes (Signed)
Office Visit Note   Patient: Deborah Osborne           Date of Birth: 17-Feb-1971           MRN: [id number] Visit Date: 07/24/2020              Requested by: Bennie Pierini, FNP [address] [place],  [place] [address] PCP: Bennie Pierini, FNP   Assessment & Plan: Visit Diagnoses:  1. Mallet deformity of little finger     Plan: 1 month status post injury to left little finger with clinical evidence of a nonfracture mallet finger.  Has been splinting and doing well.  Still has a about a 10 degree extension droop excellent flexion.  We will continue to splint at least 3 more weeks and return in a month.  She is well aware that she may always have some flexion across the DIP joint but should have good function with her hand but tickly in grip and flexion Follow-Up Instructions: Return in about 1 month (around 08/24/2020).   Orders:  No orders of the defined types were placed in this encounter.  No orders of the defined types were placed in this encounter.     Procedures: No procedures performed   Clinical Data: No additional findings.   Subjective: Chief Complaint  Patient presents with  . Left Hand - Follow-up  Patient presents today for a one month follow up on her left pinky finger. She has been wearing the splint almost all the time. No pain. No complaints. She is rihgt hand dominant.   HPI  Review of Systems   Objective: Vital Signs: Ht 5\' 4"  (1.626 m)   Wt 155 lb (70.3 kg)   BMI 26.61 kg/m   Physical Exam Constitutional:      Appearance: She is well-developed and well-nourished.  HENT:     Mouth/Throat:     Mouth: Oropharynx is clear and moist.  Eyes:     Extraocular Movements: EOM normal.     Pupils: Pupils are equal, round, and reactive to light.  Pulmonary:     Effort: Pulmonary effort is normal.  Skin:    General: Skin is warm and dry.  Neurological:     Mental Status: She is alert and oriented to person, place, and time.   Psychiatric:        Mood and Affect: Mood and affect normal.        Behavior: Behavior normal.     Ortho Exam awake alert and oriented x3.  Comfortable sitting.  Exam of the left little finger reveals about a 10 degree lack of full extension across the DIP joint.  No swelling or redness and no pain.  Has good grip and full flexion across the DIP joint.  Patient has a soft tissue mallet finger without evidence of a fracture and has been splinting  Specialty Comments:  No specialty comments available.  Imaging: No results found.   PMFS History: Patient Active Problem List   Diagnosis Date Noted  . Mallet deformity of little finger 07/24/2020  . Pain in left finger(s) 06/26/2020  . BMI 25.0-25.9,adult 03/21/2020  . GERD (gastroesophageal reflux disease) 09/26/2018  . Asthma 09/26/2018  . Constipation 02/04/2018  . Rectal bleeding 02/04/2018  . FH: colon polyps 02/04/2018  . Stress at home 05/21/2016  . S/P cholecystectomy 05/21/2016  . Tobacco use 05/21/2016  . Mixed hyperlipidemia 05/21/2016   Past Medical History:  Diagnosis Date  . Asthma   . Colon polyps  01/16/2016  . GERD (gastroesophageal reflux disease)   . Hyperlipidemia     Family History  Problem Relation Age of Onset  . Arthritis Mother   . Cancer Mother        ???  . Diabetes Mother   . Vision loss Mother   . Arthritis Father   . Early death Father   . Heart disease Father   . Stroke Father   . Arthritis Maternal Grandmother   . Colon polyps Sister        Age 72s, large colon polyp requiring resection, advised to have her sibling complete colonoscopy  . Colon cancer Neg Hx     Past Surgical History:  Procedure Laterality Date  . CHOLECYSTECTOMY  2017  . TUBAL LIGATION     Social History   Occupational History  . Not on file  Tobacco Use  . Smoking status: Current Every Day Smoker    Packs/day: 1.50    Years: 20.00    Pack years: 30.00    Types: Cigarettes  . Smokeless tobacco: Never Used   Vaping Use  . Vaping Use: Never used  Substance and Sexual Activity  . Alcohol use: No  . Drug use: No  . Sexual activity: Not on file

## 2020-09-11 ENCOUNTER — Other Ambulatory Visit: Payer: Self-pay | Admitting: Nurse Practitioner

## 2020-10-19 ENCOUNTER — Other Ambulatory Visit: Payer: Self-pay | Admitting: Nurse Practitioner

## 2020-11-11 ENCOUNTER — Encounter: Payer: Self-pay | Admitting: Nurse Practitioner

## 2020-11-11 ENCOUNTER — Ambulatory Visit (INDEPENDENT_AMBULATORY_CARE_PROVIDER_SITE_OTHER): Payer: 59 | Admitting: Nurse Practitioner

## 2020-11-11 DIAGNOSIS — J00 Acute nasopharyngitis [common cold]: Secondary | ICD-10-CM | POA: Diagnosis not present

## 2020-11-11 MED ORDER — FLUTICASONE PROPIONATE 50 MCG/ACT NA SUSP: 2.0000 | Freq: Every day | NASAL | 6 refills | Status: DC

## 2020-11-11 MED ORDER — CHLORPHEN-PE-ACETAMINOPHEN 4-10-325 MG PO TABS: 1.0000 | ORAL_TABLET | Freq: Four times a day (QID) | ORAL | 0 refills | Status: DC | PRN

## 2020-11-11 NOTE — Progress Notes (Signed)
Virtual Visit  Note Due to COVID-19 pandemic this visit was conducted virtually. This visit type was conducted due to national recommendations for restrictions regarding the COVID-19 Pandemic (e.g. social distancing, sheltering in place) in an effort to limit this patient's exposure and mitigate transmission in our community. All issues noted in this document were discussed and addressed.  A physical exam was not performed with this format.  I connected with Deborah Osborne on 11/11/20 at 1:47 by telephone and verified that I am speaking with the correct person using two identifiers. Deborah Osborne is currently located at home and no one is currently with  her during visit. The provider, Mary-Margaret Daphine Deutscher, FNP is located in their office at time of visit.  I discussed the limitations, risks, security and privacy concerns of performing an evaluation and management service by telephone and the availability of in person appointments. I also discussed with the patient that there may be a patient responsible charge related to this service. The patient expressed understanding and agreed to proceed.   History and Present Illness:   Chief Complaint: URI   HPI Patient calls in for an appointment c/o sneezing, coughing, chest congestion and post nasal drip. This started on Thursday. She has been taking claritin d and nyquil OTC and is no better.   Review of Systems  Constitutional: Positive for malaise/fatigue. Negative for chills and fever.  HENT: Positive for congestion. Negative for ear discharge, ear pain, sinus pain and sore throat.   Respiratory: Positive for cough and sputum production. Negative for shortness of breath and stridor.   Musculoskeletal: Negative for neck pain.  Neurological: Negative for dizziness and headaches.  All other systems reviewed and are negative.    Observations/Objective: Alert and oriented- answers all questions appropriately No distress Voice hoarse No  cough   Assessment and Plan: Deborah Osborne in today with chief complaint of URI   1. Acute nasopharyngitis 1. Take meds as prescribed 2. Use a cool mist humidifier especially during the winter months and when heat has been humid. 3. Use saline nose sprays frequently 4. Saline irrigations of the nose can be very helpful if done frequently.  * 4X daily for 1 week*  * Use of a nettie pot can be helpful with this. Follow directions with this* 5. Drink plenty of fluids 6. Keep thermostat turn down low 7.For any cough or congestion  Use plain Mucinex- regular strength or max strength is fine   * Children- consult with Pharmacist for dosing 8. For fever or aces or pains- take tylenol or ibuprofen appropriate for age and weight.  * for fevers greater than 101 orally you may alternate ibuprofen and tylenol every  3 hours.   Meds ordered this encounter  Medications  . fluticasone (FLONASE) 50 MCG/ACT nasal spray    Sig: Place 2 sprays into both nostrils daily.    Dispense:  16 g    Refill:  6    Order Specific Question:   Supervising Provider    Answer:   Arville Care A [id number]  . Chlorphen-PE-Acetaminophen 4-10-325 MG TABS    Sig: Take 1 tablet by mouth every 6 (six) hours as needed.    Dispense:  20 tablet    Refill:  0    Order Specific Question:   Supervising Provider    Answer:   Arville Care A [[id number]]        Follow Up Instructions: prn     I discussed the assessment and treatment plan with  the patient. The patient was provided an opportunity to ask questions and all were answered. The patient agreed with the plan and demonstrated an understanding of the instructions.   The patient was advised to call back or seek an in-person evaluation if the symptoms worsen or if the condition fails to improve as anticipated.  The above assessment and management plan was discussed with the patient. The patient verbalized understanding of and has agreed to the management  plan. Patient is aware to call the clinic if symptoms persist or worsen. Patient is aware when to return to the clinic for a follow-up visit. Patient educated on when it is appropriate to go to the emergency department.   Time call ended:  2:00  I provided 13 minutes of  non face-to-face time during this encounter.    Mary-Margaret Daphine Deutscher, FNP

## 2020-11-26 ENCOUNTER — Other Ambulatory Visit: Payer: Self-pay | Admitting: Nurse Practitioner

## 2020-12-15 ENCOUNTER — Other Ambulatory Visit: Payer: Self-pay | Admitting: Nurse Practitioner

## 2020-12-16 ENCOUNTER — Other Ambulatory Visit: Payer: Self-pay | Admitting: *Deleted

## 2020-12-16 DIAGNOSIS — E782 Mixed hyperlipidemia: Secondary | ICD-10-CM

## 2020-12-24 ENCOUNTER — Encounter: Payer: Self-pay | Admitting: Nurse Practitioner

## 2020-12-24 ENCOUNTER — Other Ambulatory Visit: Payer: Self-pay

## 2020-12-24 ENCOUNTER — Ambulatory Visit (INDEPENDENT_AMBULATORY_CARE_PROVIDER_SITE_OTHER): Payer: 59 | Admitting: Nurse Practitioner

## 2020-12-24 VITALS — BP 123/76 | HR 74 | Temp 97.9°F | Resp 20 | Ht 64.0 in | Wt 153.0 lb

## 2020-12-24 DIAGNOSIS — K5900 Constipation, unspecified: Secondary | ICD-10-CM

## 2020-12-24 DIAGNOSIS — K219 Gastro-esophageal reflux disease without esophagitis: Secondary | ICD-10-CM

## 2020-12-24 DIAGNOSIS — J4520 Mild intermittent asthma, uncomplicated: Secondary | ICD-10-CM | POA: Diagnosis not present

## 2020-12-24 DIAGNOSIS — E782 Mixed hyperlipidemia: Secondary | ICD-10-CM

## 2020-12-24 DIAGNOSIS — Z6825 Body mass index (BMI) 25.0-25.9, adult: Secondary | ICD-10-CM

## 2020-12-24 DIAGNOSIS — Z720 Tobacco use: Secondary | ICD-10-CM

## 2020-12-24 MED ORDER — PRAVASTATIN SODIUM 80 MG PO TABS: 80.0000 mg | ORAL_TABLET | Freq: Every day | ORAL | 1 refills | Status: DC

## 2020-12-24 MED ORDER — OMEPRAZOLE MAGNESIUM 20 MG PO TBEC
20.0000 mg | DELAYED_RELEASE_TABLET | Freq: Every day | ORAL | 1 refills | Status: DC
Start: 2020-12-24 — End: 2021-06-26

## 2020-12-24 MED ORDER — FENOFIBRATE 145 MG PO TABS: 145.0000 mg | ORAL_TABLET | Freq: Every day | ORAL | 1 refills | Status: DC

## 2020-12-24 NOTE — Patient Instructions (Signed)
Managing the Challenge of Quitting Smoking Quitting smoking is a physical and mental challenge. You will face cravings, withdrawal symptoms, and temptation. Before quitting, work with your health care provider to make a plan that can help you manage quitting. Preparation can help you quit and keep you from giving in. How to manage lifestyle changes Managing stress Stress can make you want to smoke, and wanting to smoke may cause stress. It is important to find ways to manage your stress. You might try some of the following:  Practice relaxation techniques. ? Breathe slowly and deeply, in through your nose and out through your mouth. ? Listen to music. ? Soak in a bath or take a shower. ? Imagine a peaceful place or vacation.  Get some support. ? Talk with family or friends about your stress. ? Join a support group. ? Talk with a counselor or therapist.  Get some physical activity. ? Go for a walk, run, or bike ride. ? Play a favorite sport. ? Practice yoga.   Medicines Talk with your health care provider about medicines that might help you deal with cravings and make quitting easier for you. Relationships Social situations can be difficult when you are quitting smoking. To manage this, you can:  Avoid parties and other social situations where people might be smoking.  Avoid alcohol.  Leave right away if you have the urge to smoke.  Explain to your family and friends that you are quitting smoking. Ask for support and let them know you might be a bit grumpy.  Plan activities where smoking is not an option. General instructions Be aware that many people gain weight after they quit smoking. However, not everyone does. To keep from gaining weight, have a plan in place before you quit and stick to the plan after you quit. Your plan should include:  Having healthy snacks. When you have a craving, it may help to: ? Eat popcorn, carrots, celery, or other cut vegetables. ? Chew  sugar-free gum.  Changing how you eat. ? Eat small portion sizes at meals. ? Eat 4-6 small meals throughout the day instead of 1-2 large meals a day. ? Be mindful when you eat. Do not watch television or do other things that might distract you as you eat.  Exercising regularly. ? Make time to exercise each day. If you do not have time for a long workout, do short bouts of exercise for 5-10 minutes several times a day. ? Do some form of strengthening exercise, such as weight lifting. ? Do some exercise that gets your heart beating and causes you to breathe deeply, such as walking fast, running, swimming, or biking. This is very important.  Drinking plenty of water or other low-calorie or no-calorie drinks. Drink 6-8 glasses of water daily.   How to recognize withdrawal symptoms Your body and mind may experience discomfort as you try to get used to not having nicotine in your system. These effects are called withdrawal symptoms. They may include:  Feeling hungrier than normal.  Having trouble concentrating.  Feeling irritable or restless.  Having trouble sleeping.  Feeling depressed.  Craving a cigarette. To manage withdrawal symptoms:  Avoid places, people, and activities that trigger your cravings.  Remember why you want to quit.  Get plenty of sleep.  Avoid coffee and other caffeinated drinks. These may worsen some of your symptoms. These symptoms may surprise you. But be assured that they are normal to have when quitting smoking. How to manage cravings   Come up with a plan for how to deal with your cravings. The plan should include the following:  A definition of the specific situation you want to deal with.  An alternative action you will take.  A clear idea for how this action will help.  The name of someone who might help you with this. Cravings usually last for 5-10 minutes. Consider taking the following actions to help you with your plan to deal with  cravings:  Keep your mouth busy. ? Chew sugar-free gum. ? Suck on hard candies or a straw. ? Brush your teeth.  Keep your hands and body busy. ? Change to a different activity right away. ? Squeeze or play with a ball. ? Do an activity or a hobby, such as making bead jewelry, practicing needlepoint, or working with wood. ? Mix up your normal routine. ? Take a short exercise break. Go for a quick walk or run up and down stairs.  Focus on doing something kind or helpful for someone else.  Call a friend or family member to talk during a craving.  Join a support group.  Contact a quitline. Where to find support To get help or find a support group:  Call the National Cancer Institute's Smoking Quitline: 1-800-QUIT NOW (784-8669)  Visit the website of the Substance Abuse and Mental Health Services Administration: www.samhsa.gov  Text QUIT to SmokefreeTXT: 478848 Where to find more information Visit these websites to find more information on quitting smoking:  National Cancer Institute: www.smokefree.gov  American Lung Association: www.lung.org  American Cancer Society: www.cancer.org  Centers for Disease Control and Prevention: www.cdc.gov  American Heart Association: www.heart.org Contact a health care provider if:  You want to change your plan for quitting.  The medicines you are taking are not helping.  Your eating feels out of control or you cannot sleep. Get help right away if:  You feel depressed or become very anxious. Summary  Quitting smoking is a physical and mental challenge. You will face cravings, withdrawal symptoms, and temptation to smoke again. Preparation can help you as you go through these challenges.  Try different techniques to manage stress, handle social situations, and prevent weight gain.  You can deal with cravings by keeping your mouth busy (such as by chewing gum), keeping your hands and body busy, calling family or friends, or  contacting a quitline for people who want to quit smoking.  You can deal with withdrawal symptoms by avoiding places where people smoke, getting plenty of rest, and avoiding drinks with caffeine. This information is not intended to replace advice given to you by your health care provider. Make sure you discuss any questions you have with your health care provider. Document Revised: 05/16/2019 Document Reviewed: 05/16/2019 Elsevier Patient Education  2021 [employer]  

## 2020-12-24 NOTE — Progress Notes (Signed)
 Subjective:    Patient ID: Deborah Osborne, female    DOB: 08/20/1970, 50 y.o.   MRN: [id number]   Chief Complaint: medical management of chronic issues     HPI:  1. Mixed hyperlipidemia No c/o chest pain, sob or headache. Does not check blood pressure at home. Is on pravachol but has been out for 2 weeks. BP Readings from Last 3 Encounters:  06/24/20 122/71  03/21/20 128/80  12/18/19 114/65     2. Gastroesophageal reflux disease without esophagitis Is on omeprazole daily and is doing well.  3. Mild intermittent asthma without complication Has albuterol to use as needed. Uses albuterol very seldom. Lees that 1x a month.  4. Constipation, unspecified constipation type Currently having no issues.  5. Tobacco use Smokes 1 1/2 packs a day.   6. BMI 25.0-25.9,adult No recent weight changes Wt Readings from Last 3 Encounters:  12/24/20 153 lb (69.4 kg)  07/24/20 155 lb (70.3 kg)  06/26/20 155 lb (70.3 kg)   BMI Readings from Last 3 Encounters:  12/24/20 26.26 kg/m  07/24/20 26.61 kg/m  06/26/20 26.61 kg/m       Outpatient Encounter Medications as of 12/24/2020  Medication Sig  . albuterol (PROVENTIL) (2.5 MG/3ML) 0.083% nebulizer solution Take 3 mLs (2.5 mg total) by nebulization every 6 (six) hours as needed for wheezing or shortness of breath.  . Albuterol Sulfate (PROAIR RESPICLICK) 108 (90 Base) MCG/ACT AEPB Inhale 1-2 puffs into the lungs 4 (four) times daily as needed (shob, wheezing). prn  . Chlorphen-PE-Acetaminophen 4-10-325 MG TABS Take 1 tablet by mouth every 6 (six) hours as needed.  . Cyanocobalamin (VITAMIN B-12 PO) Take by mouth.  . fenofibrate (TRICOR) 145 MG tablet Take 1 tablet (145 mg total) by mouth daily. (NEEDS TO BE SEEN BEFORE NEXT REFILL)  . fluticasone (FLONASE) 50 MCG/ACT nasal spray Place 2 sprays into both nostrils daily.  . Multiple Vitamins-Minerals (CENTRUM ADULTS PO) Take by mouth.  . omeprazole (PRILOSEC OTC) 20 MG tablet Take 20  mg by mouth daily.  . pravastatin (PRAVACHOL) 80 MG tablet Take 1 tablet (80 mg total) by mouth daily.   No facility-administered encounter medications on file as of 12/24/2020.    Past Surgical History:  Procedure Laterality Date  . CHOLECYSTECTOMY  2017  . TUBAL LIGATION      Family History  Problem Relation Age of Onset  . Arthritis Mother   . Cancer Mother        ???  . Diabetes Mother   . Vision loss Mother   . Arthritis Father   . Early death Father   . Heart disease Father   . Stroke Father   . Arthritis Maternal Grandmother   . Colon polyps Sister        Age 40s, large colon polyp requiring resection, advised to have her sibling complete colonoscopy  . Colon cancer Neg Hx     New complaints: None today  Social history: Lives with her husband of 13 years  Controlled substance contract: n/a    Review of Systems  Constitutional: Negative for diaphoresis.  Eyes: Negative for pain.  Respiratory: Negative for shortness of breath.   Cardiovascular: Negative for chest pain, palpitations and leg swelling.  Gastrointestinal: Negative for abdominal pain.  Endocrine: Negative for polydipsia.  Skin: Negative for rash.  Neurological: Negative for dizziness, weakness and headaches.  Hematological: Does not bruise/bleed easily.  All other systems reviewed and are negative.      Objective:     Physical Exam Vitals and nursing note reviewed.  Constitutional:      General: She is not in acute distress.    Appearance: Normal appearance. She is well-developed.  HENT:     Head: Normocephalic.     Nose: Nose normal.  Eyes:     Pupils: Pupils are equal, round, and reactive to light.  Neck:     Vascular: No carotid bruit or JVD.  Cardiovascular:     Rate and Rhythm: Normal rate and regular rhythm.     Heart sounds: Normal heart sounds.  Pulmonary:     Effort: Pulmonary effort is normal. No respiratory distress.     Breath sounds: Normal breath sounds. No wheezing or  rales.  Chest:     Chest wall: No tenderness.  Abdominal:     General: Bowel sounds are normal. There is no distension or abdominal bruit.     Palpations: Abdomen is soft. There is no hepatomegaly, splenomegaly, mass or pulsatile mass.     Tenderness: There is no abdominal tenderness.  Musculoskeletal:        General: Normal range of motion.     Cervical back: Normal range of motion and neck supple.  Lymphadenopathy:     Cervical: No cervical adenopathy.  Skin:    General: Skin is warm and dry.  Neurological:     Mental Status: She is alert and oriented to person, place, and time.     Deep Tendon Reflexes: Reflexes are normal and symmetric.  Psychiatric:        Behavior: Behavior normal.        Thought Content: Thought content normal.        Judgment: Judgment normal.    BP 123/76   Pulse 74   Temp 97.9 F (36.6 C) (Temporal)   Resp 20   Ht 5' 4" (1.626 m)   Wt 153 lb (69.4 kg)   SpO2 93%   BMI 26.26 kg/m         Assessment & Plan:  Deborah Osborne comes in today with chief complaint of Medical Management of Chronic Issues   Diagnosis and orders addressed:  1. Mixed hyperlipidemia Low fat diet - pravastatin (PRAVACHOL) 80 MG tablet; Take 1 tablet (80 mg total) by mouth daily.  Dispense: 90 tablet; Refill: 1 - fenofibrate (TRICOR) 145 MG tablet; Take 1 tablet (145 mg total) by mouth daily. (NEEDS TO BE SEEN BEFORE NEXT REFILL)  Dispense: 90 tablet; Refill: 1 - CBC with Differential/Platelet - CMP14+EGFR - Lipid panel  2. Gastroesophageal reflux disease without esophagitis Avoid spicy foods Do not eat 2 hours prior to bedtime - omeprazole (PRILOSEC OTC) 20 MG tablet; Take 1 tablet (20 mg total) by mouth daily.  Dispense: 90 tablet; Refill: 1  3. Mild intermittent asthma without complication If using albuterol weekly need to let us know so can put you on maintence inhaler  4. Constipation, unspecified constipation type Increase fiber in diet miralax if  needed  5. Tobacco use Smoking cessation encouraged  6. BMI 25.0-25.9,adult Discussed diet and exercise for person with BMI >25 Will recheck weight in 3-6 months    Labs pending Health Maintenance reviewed Diet and exercise encouraged  Follow up plan: 6 months   Mary-Margaret Hassell Done, FNP

## 2021-06-19 ENCOUNTER — Other Ambulatory Visit: Payer: Self-pay | Admitting: Nurse Practitioner

## 2021-06-19 DIAGNOSIS — E782 Mixed hyperlipidemia: Secondary | ICD-10-CM

## 2021-06-26 ENCOUNTER — Ambulatory Visit (INDEPENDENT_AMBULATORY_CARE_PROVIDER_SITE_OTHER): Payer: 59 | Admitting: Nurse Practitioner

## 2021-06-26 ENCOUNTER — Other Ambulatory Visit (HOSPITAL_COMMUNITY)
Admission: RE | Admit: 2021-06-26 | Discharge: 2021-06-26 | Disposition: A | Discharge status: Home / Self Care | Payer: Self-pay | Source: Ambulatory Visit | Attending: Nurse Practitioner | Admitting: Nurse Practitioner

## 2021-06-26 ENCOUNTER — Encounter: Payer: Self-pay | Admitting: Nurse Practitioner

## 2021-06-26 ENCOUNTER — Other Ambulatory Visit: Payer: Self-pay

## 2021-06-26 VITALS — BP 140/84 | HR 78 | Temp 97.3°F | Resp 20 | Ht 64.0 in | Wt 156.0 lb

## 2021-06-26 DIAGNOSIS — K5900 Constipation, unspecified: Secondary | ICD-10-CM

## 2021-06-26 DIAGNOSIS — Z0000 Encounter for general adult medical examination without abnormal findings: Secondary | ICD-10-CM

## 2021-06-26 DIAGNOSIS — Z0001 Encounter for general adult medical examination with abnormal findings: Secondary | ICD-10-CM

## 2021-06-26 DIAGNOSIS — Z23 Encounter for immunization: Secondary | ICD-10-CM

## 2021-06-26 DIAGNOSIS — E039 Hypothyroidism, unspecified: Secondary | ICD-10-CM

## 2021-06-26 DIAGNOSIS — Z720 Tobacco use: Secondary | ICD-10-CM

## 2021-06-26 DIAGNOSIS — K219 Gastro-esophageal reflux disease without esophagitis: Secondary | ICD-10-CM | POA: Diagnosis not present

## 2021-06-26 DIAGNOSIS — J4520 Mild intermittent asthma, uncomplicated: Secondary | ICD-10-CM | POA: Diagnosis not present

## 2021-06-26 DIAGNOSIS — E782 Mixed hyperlipidemia: Secondary | ICD-10-CM

## 2021-06-26 DIAGNOSIS — Z8269 Family history of other diseases of the musculoskeletal system and connective tissue: Secondary | ICD-10-CM

## 2021-06-26 DIAGNOSIS — Z6825 Body mass index (BMI) 25.0-25.9, adult: Secondary | ICD-10-CM

## 2021-06-26 MED ORDER — PRAVASTATIN SODIUM 80 MG PO TABS: 80.0000 mg | ORAL_TABLET | Freq: Every day | ORAL | 1 refills | Status: DC

## 2021-06-26 MED ORDER — OMEPRAZOLE MAGNESIUM 20 MG PO TBEC: 20.0000 mg | DELAYED_RELEASE_TABLET | Freq: Every day | ORAL | 1 refills | Status: DC

## 2021-06-26 MED ORDER — FENOFIBRATE 145 MG PO TABS
145.0000 mg | ORAL_TABLET | Freq: Every day | ORAL | 1 refills | Status: DC
Start: 2021-06-26 — End: 2021-12-24

## 2021-06-26 NOTE — Patient Instructions (Signed)
Exercising to Stay Healthy °To become healthy and stay healthy, it is recommended that you do moderate-intensity and vigorous-intensity exercise. You can tell that you are exercising at a moderate intensity if your heart starts beating faster and you start breathing faster but can still hold a conversation. You can tell that you are exercising at a vigorous intensity if you are breathing much harder and faster and cannot hold a conversation while exercising. °How can exercise benefit me? °Exercising regularly is important. It has many health benefits, such as: °Improving overall fitness, flexibility, and endurance. °Increasing bone density. °Helping with weight control. °Decreasing body fat. °Increasing muscle strength and endurance. °Reducing stress and tension, anxiety, depression, or anger. °Improving overall health. °What guidelines should I follow while exercising? °Before you start a new exercise program, talk with your health care provider. °Do not exercise so much that you hurt yourself, feel dizzy, or get very short of breath. °Wear comfortable clothes and wear shoes with good support. °Drink plenty of water while you exercise to prevent dehydration or heat stroke. °Work out until your breathing and your heartbeat get faster (moderate intensity). °How often should I exercise? °Choose an activity that you enjoy, and set realistic goals. Your health care provider can help you make an activity plan that is individually designed and works best for you. °Exercise regularly as told by your health care provider. This may include: °Doing strength training two times a week, such as: °Lifting weights. °Using resistance bands. °Push-ups. °Sit-ups. °Yoga. °Doing a certain intensity of exercise for a given amount of time. Choose from these options: °A total of 150 minutes of moderate-intensity exercise every week. °A total of 75 minutes of vigorous-intensity exercise every week. °A mix of moderate-intensity and  vigorous-intensity exercise every week. °Children, pregnant women, people who have not exercised regularly, people who are overweight, and older adults may need to talk with a health care provider about what activities are safe to perform. If you have a medical condition, be sure to talk with your health care provider before you start a new exercise program. °What are some exercise ideas? °Moderate-intensity exercise ideas include: °Walking 1 mile (1.6 km) in about 15 minutes. °Biking. °Hiking. °Golfing. °Dancing. °Water aerobics. °Vigorous-intensity exercise ideas include: °Walking 4.5 miles (7.2 km) or more in about 1 hour. °Jogging or running 5 miles (8 km) in about 1 hour. °Biking 10 miles (16.1 km) or more in about 1 hour. °Lap swimming. °Roller-skating or in-line skating. °Cross-country skiing. °Vigorous competitive sports, such as football, basketball, and soccer. °Jumping rope. °Aerobic dancing. °What are some everyday activities that can help me get exercise? °Yard work, such as: °Pushing a lawn mower. °Raking and bagging leaves. °Washing your car. °Pushing a stroller. °Shoveling snow. °Gardening. °Washing windows or floors. °How can I be more active in my day-to-day activities? °Use stairs instead of an elevator. °Take a walk during your lunch break. °If you drive, park your car farther away from your work or school. °If you take public transportation, get off one stop early and walk the rest of the way. °Stand up or walk around during all of your indoor phone calls. °Get up, stretch, and walk around every 30 minutes throughout the day. °Enjoy exercise with a friend. Support to continue exercising will help you keep a regular routine of activity. °Where to find more information °You can find more information about exercising to stay healthy from: °[place] Department of Health and Human Services: www.hhs.gov °Centers for Disease Control and Prevention (  CDC): www.cdc.gov °Summary °Exercising regularly is  important. It will improve your overall fitness, flexibility, and endurance. °Regular exercise will also improve your overall health. It can help you control your weight, reduce stress, and improve your bone density. °Do not exercise so much that you hurt yourself, feel dizzy, or get very short of breath. °Before you start a new exercise program, talk with your health care provider. °This information is not intended to replace advice given to you by your health care provider. Make sure you discuss any questions you have with your health care provider. °Document Revised: 11/22/2020 Document Reviewed: 11/22/2020 °Elsevier Patient Education © 2022 [employer] Inc. ° °

## 2021-06-26 NOTE — Progress Notes (Signed)
Subjective:    Patient ID: Deborah Osborne, female    DOB: 1970/08/31, 50 y.o.   MRN: [id number]   Chief Complaint: Annual Exam    HPI:  1. Annual physical exam Also is getting PAP today. LMP was 03/19/21. Has been having hot flashes  2. Mixed hyperlipidemia Does try to watch diet some what. Lab Results  Component Value Date   CHOL 162 03/21/2020   HDL 30 (L) 03/21/2020   LDLCALC 91 03/21/2020   TRIG 239 (H) 03/21/2020   CHOLHDL 5.4 (H) 03/21/2020     3. Gastroesophageal reflux disease without esophagitis Is on omperazole and is doing well  4. Mild intermittent asthma without complication Doing well. Has not needed ot use albuterol inhaler  5. Constipation, unspecified constipation type Doing better. No issues  6. Tobacco use Smokes about 1 1/2 packs a day.  7. BMI 25.0-25.9,adult No recent weight changes Wt Readings from Last 3 Encounters:  06/26/21 156 lb (70.8 kg)  12/24/20 153 lb (69.4 kg)  07/24/20 155 lb (70.3 kg)   BMI Readings from Last 3 Encounters:  06/26/21 26.78 kg/m  12/24/20 26.26 kg/m  07/24/20 26.61 kg/m       Outpatient Encounter Medications as of 06/26/2021  Medication Sig   albuterol (PROVENTIL) (2.5 MG/3ML) 0.083% nebulizer solution Take 3 mLs (2.5 mg total) by nebulization every 6 (six) hours as needed for wheezing or shortness of breath.   Albuterol Sulfate (PROAIR RESPICLICK) 921 (90 Base) MCG/ACT AEPB Inhale 1-2 puffs into the lungs 4 (four) times daily as needed (shob, wheezing). prn   fenofibrate (TRICOR) 145 MG tablet Take 1 tablet (145 mg total) by mouth daily.   Multiple Vitamins-Minerals (CENTRUM ADULTS PO) Take by mouth.   omeprazole (PRILOSEC OTC) 20 MG tablet Take 1 tablet (20 mg total) by mouth daily.   pravastatin (PRAVACHOL) 80 MG tablet Take 1 tablet by mouth once daily   [DISCONTINUED] Cyanocobalamin (VITAMIN B-12 PO) Take by mouth.   No facility-administered encounter medications on file as of 06/26/2021.     Past Surgical History:  Procedure Laterality Date   CHOLECYSTECTOMY  2017   TUBAL LIGATION      Family History  Problem Relation Age of Onset   Arthritis Mother    Cancer Mother        ???   Diabetes Mother    Vision loss Mother    Arthritis Father    Early death Father    Heart disease Father    Stroke Father    Arthritis Maternal Grandmother    Colon polyps Sister        Age 70s, large colon polyp requiring resection, advised to have her sibling complete colonoscopy   Colon cancer Neg Hx     New complaints: None today  Social history: Lives with her husband. Her daughter was just diagnosed with ankylosing spondylitis  Controlled substance contract: n/a     Review of Systems  Constitutional:  Negative for diaphoresis.  Eyes:  Negative for pain.  Respiratory:  Negative for shortness of breath.   Cardiovascular:  Negative for chest pain, palpitations and leg swelling.  Gastrointestinal:  Negative for abdominal pain.  Endocrine: Negative for polydipsia.  Skin:  Negative for rash.  Neurological:  Negative for dizziness, weakness and headaches.  Hematological:  Does not bruise/bleed easily.  All other systems reviewed and are negative.     Objective:   Physical Exam Vitals and nursing note reviewed.  Constitutional:      General:  She is not in acute distress.    Appearance: Normal appearance. She is well-developed.  HENT:     Head: Normocephalic.     Right Ear: Tympanic membrane normal.     Left Ear: Tympanic membrane normal.     Nose: Nose normal.     Mouth/Throat:     Mouth: Mucous membranes are moist.  Eyes:     Pupils: Pupils are equal, round, and reactive to light.  Neck:     Vascular: No carotid bruit or JVD.  Cardiovascular:     Rate and Rhythm: Normal rate and regular rhythm.     Heart sounds: Normal heart sounds.  Pulmonary:     Effort: Pulmonary effort is normal. No respiratory distress.     Breath sounds: Normal breath sounds. No  wheezing or rales.  Chest:     Chest wall: No tenderness.  Abdominal:     General: Bowel sounds are normal. There is no distension or abdominal bruit.     Palpations: Abdomen is soft. There is no hepatomegaly, splenomegaly, mass or pulsatile mass.     Tenderness: There is no abdominal tenderness.  Musculoskeletal:        General: Normal range of motion.     Cervical back: Normal range of motion and neck supple.  Lymphadenopathy:     Cervical: No cervical adenopathy.  Skin:    General: Skin is warm and dry.  Neurological:     Mental Status: She is alert and oriented to person, place, and time.     Deep Tendon Reflexes: Reflexes are normal and symmetric.  Psychiatric:        Behavior: Behavior normal.        Thought Content: Thought content normal.        Judgment: Judgment normal.    BP 140/84   Pulse 78   Temp (!) 97.3 F (36.3 C) (Temporal)   Resp 20   Ht _0  (1.626 m)   Wt 156 lb (70.8 kg)   SpO2 96%   BMI 26.78 kg/m        Assessment & Plan:  Deborah Osborne comes in today with chief complaint of Annual Exam   Diagnosis and orders addressed:  1. Annual physical exam Labs pending - Thyroid Panel With TSH - Cytology - PAP  2. Mixed hyperlipidemia Low fat diet - CBC with Differential/Platelet - CMP14+EGFR - Lipid panel - fenofibrate (TRICOR) 145 MG tablet; Take 1 tablet (145 mg total) by mouth daily.  Dispense: 90 tablet; Refill: 1 - pravastatin (PRAVACHOL) 80 MG tablet; Take 1 tablet (80 mg total) by mouth daily.  Dispense: 90 tablet; Refill: 1  3. Gastroesophageal reflux disease without esophagitis Avoid spicy foods Do not eat 2 hours prior to bedtime - omeprazole (PRILOSEC OTC) 20 MG tablet; Take 1 tablet (20 mg total) by mouth daily.  Dispense: 90 tablet; Refill: 1  4. Mild intermittent asthma without complication  5. Constipation, unspecified constipation type  6. Tobacco use Smoking cessation encourgaed - CT CHEST LUNG CA SCREEN LOW DOSE W/O  CM; Future  7. BMI 25.0-25.9,adult Discussed diet and exercise for person with BMI >25 Will recheck weight in 3-6 months   8. Family history of ankylosing spondylitis Labs pending - HLA-B27 Antigen   Labs pending Health Maintenance reviewed Diet and exercise encouraged  Follow up plan: 6 months   Mary-Margaret Hassell Done, FNP

## 2021-06-27 MED ORDER — LEVOTHYROXINE SODIUM 50 MCG PO TABS: 50.0000 ug | ORAL_TABLET | Freq: Every day | ORAL | 3 refills | Status: DC

## 2021-06-27 NOTE — Addendum Note (Signed)
Addended by: Bennie Pierini on: 06/27/2021 01:59 PM   Modules accepted: Orders

## 2021-06-28 ENCOUNTER — Other Ambulatory Visit: Payer: Self-pay | Admitting: Nurse Practitioner

## 2021-06-28 DIAGNOSIS — K219 Gastro-esophageal reflux disease without esophagitis: Secondary | ICD-10-CM

## 2021-06-30 NOTE — Progress Notes (Signed)
Returning nurse call / please call cell [phone]

## 2021-07-01 ENCOUNTER — Other Ambulatory Visit: Payer: Self-pay

## 2021-07-01 DIAGNOSIS — K219 Gastro-esophageal reflux disease without esophagitis: Secondary | ICD-10-CM

## 2021-07-01 MED ORDER — OMEPRAZOLE 20 MG PO CPDR: 20.0000 mg | DELAYED_RELEASE_CAPSULE | Freq: Every day | ORAL | 1 refills | Status: DC

## 2021-07-02 LAB — CYTOLOGY - PAP: Diagnosis: NEGATIVE

## 2021-07-02 LAB — CBC WITH DIFFERENTIAL/PLATELET
Basophils Absolute: 0 10*3/uL (ref 0.0–0.2)
Basos: 0 %
EOS (ABSOLUTE): 0.1 10*3/uL (ref 0.0–0.4)
Eos: 1 %
Hematocrit: 39.9 % (ref 34.0–46.6)
Hemoglobin: 12.8 g/dL (ref 11.1–15.9)
Immature Grans (Abs): 0 10*3/uL (ref 0.0–0.1)
Immature Granulocytes: 0 %
Lymphocytes Absolute: 3.8 10*3/uL — ABNORMAL HIGH (ref 0.7–3.1)
Lymphs: 26 %
MCH: 29.4 pg (ref 26.6–33.0)
MCHC: 32.1 g/dL (ref 31.5–35.7)
MCV: 92 fL (ref 79–97)
Monocytes Absolute: 1 10*3/uL — ABNORMAL HIGH (ref 0.1–0.9)
Monocytes: 7 %
Neutrophils Absolute: 9.6 10*3/uL — ABNORMAL HIGH (ref 1.4–7.0)
Neutrophils: 66 %
Platelets: 398 10*3/uL (ref 150–450)
RBC: 4.36 x10E6/uL (ref 3.77–5.28)
RDW: 12.9 % (ref 11.7–15.4)
WBC: 14.5 10*3/uL — ABNORMAL HIGH (ref 3.4–10.8)

## 2021-07-02 LAB — HLA-B27 ANTIGEN: HLA B27: POSITIVE

## 2021-07-02 LAB — CMP14+EGFR
ALT: 11 IU/L (ref 0–32)
AST: 18 IU/L (ref 0–40)
Albumin/Globulin Ratio: 1.8 (ref 1.2–2.2)
Albumin: 4.4 g/dL (ref 3.8–4.8)
Alkaline Phosphatase: 55 IU/L (ref 44–121)
BUN/Creatinine Ratio: 10 (ref 9–23)
BUN: 12 mg/dL (ref 6–24)
Bilirubin Total: <0.2 mg/dL (ref 0.0–1.2)
CO2: 24 mmol/L (ref 20–29)
Calcium: 9.8 mg/dL (ref 8.7–10.2)
Chloride: 100 mmol/L (ref 96–106)
Creatinine, Ser: 1.23 mg/dL — ABNORMAL HIGH (ref 0.57–1.00)
Globulin, Total: 2.4 g/dL (ref 1.5–4.5)
Glucose: 87 mg/dL (ref 70–99)
Potassium: 4.7 mmol/L (ref 3.5–5.2)
Sodium: 139 mmol/L (ref 134–144)
Total Protein: 6.8 g/dL (ref 6.0–8.5)
eGFR: 54 mL/min/{1.73_m2} — ABNORMAL LOW (ref >59)

## 2021-07-02 LAB — LIPID PANEL
Chol/HDL Ratio: 5.4 ratio — ABNORMAL HIGH (ref 0.0–4.4)
Cholesterol, Total: 184 mg/dL (ref 100–199)
HDL: 34 mg/dL — ABNORMAL LOW (ref >39)
LDL Chol Calc (NIH): 104 mg/dL — ABNORMAL HIGH (ref 0–99)
Triglycerides: 271 mg/dL — ABNORMAL HIGH (ref 0–149)
VLDL Cholesterol Cal: 46 mg/dL — ABNORMAL HIGH (ref 5–40)

## 2021-07-02 LAB — THYROID PANEL WITH TSH
Free Thyroxine Index: 1.9 (ref 1.2–4.9)
T3 Uptake Ratio: 24 % (ref 24–39)
T4, Total: 8.1 ug/dL (ref 4.5–12.0)
TSH: 5.27 u[IU]/mL — ABNORMAL HIGH (ref 0.450–4.500)

## 2021-07-14 NOTE — Addendum Note (Signed)
Addended byDory Peru on: 07/14/2021 04:14 PM   Modules accepted: Orders

## 2021-09-03 ENCOUNTER — Telehealth: Payer: Self-pay | Admitting: Nurse Practitioner

## 2021-09-03 DIAGNOSIS — Z8371 Family history of colonic polyps: Secondary | ICD-10-CM

## 2021-09-04 NOTE — Telephone Encounter (Signed)
Referral done

## 2021-09-04 NOTE — Telephone Encounter (Signed)
Please do referral.

## 2021-09-08 ENCOUNTER — Other Ambulatory Visit: Payer: Self-pay | Admitting: Nurse Practitioner

## 2021-09-08 DIAGNOSIS — Z1231 Encounter for screening mammogram for malignant neoplasm of breast: Secondary | ICD-10-CM

## 2021-09-15 ENCOUNTER — Ambulatory Visit
Admission: RE | Admit: 2021-09-15 | Discharge: 2021-09-15 | Disposition: A | Discharge status: Home / Self Care | Payer: Self-pay | Source: Ambulatory Visit | Attending: Nurse Practitioner | Admitting: Nurse Practitioner

## 2021-09-15 DIAGNOSIS — Z1231 Encounter for screening mammogram for malignant neoplasm of breast: Secondary | ICD-10-CM

## 2021-12-24 ENCOUNTER — Ambulatory Visit (INDEPENDENT_AMBULATORY_CARE_PROVIDER_SITE_OTHER): Payer: 59 | Admitting: Nurse Practitioner

## 2021-12-24 ENCOUNTER — Encounter: Payer: Self-pay | Admitting: Nurse Practitioner

## 2021-12-24 VITALS — BP 151/88 | HR 66 | Temp 97.9°F | Resp 20 | Ht 64.0 in | Wt 148.0 lb

## 2021-12-24 DIAGNOSIS — E782 Mixed hyperlipidemia: Secondary | ICD-10-CM

## 2021-12-24 DIAGNOSIS — J4520 Mild intermittent asthma, uncomplicated: Secondary | ICD-10-CM | POA: Diagnosis not present

## 2021-12-24 DIAGNOSIS — Z6825 Body mass index (BMI) 25.0-25.9, adult: Secondary | ICD-10-CM

## 2021-12-24 DIAGNOSIS — E039 Hypothyroidism, unspecified: Secondary | ICD-10-CM

## 2021-12-24 DIAGNOSIS — K5900 Constipation, unspecified: Secondary | ICD-10-CM

## 2021-12-24 DIAGNOSIS — Z720 Tobacco use: Secondary | ICD-10-CM

## 2021-12-24 DIAGNOSIS — K219 Gastro-esophageal reflux disease without esophagitis: Secondary | ICD-10-CM | POA: Diagnosis not present

## 2021-12-24 DIAGNOSIS — Z23 Encounter for immunization: Secondary | ICD-10-CM | POA: Diagnosis not present

## 2021-12-24 MED ORDER — PRAVASTATIN SODIUM 80 MG PO TABS: 80.0000 mg | ORAL_TABLET | Freq: Every day | ORAL | 1 refills | Status: DC

## 2021-12-24 MED ORDER — FENOFIBRATE 145 MG PO TABS: 145.0000 mg | ORAL_TABLET | Freq: Every day | ORAL | 1 refills | Status: DC

## 2021-12-24 MED ORDER — OMEPRAZOLE 20 MG PO CPDR: 20.0000 mg | DELAYED_RELEASE_CAPSULE | Freq: Every day | ORAL | 1 refills | Status: DC

## 2021-12-24 NOTE — Patient Instructions (Signed)
Asthma, Adult  Asthma is a condition that causes swelling and narrowing of the airways. These are the passages that lead from the nose and mouth down into the lungs. When asthma symptoms get worse it is called an asthma attack or flare. This can make it hard to breathe. Asthma flares can range from minor to life-threatening. There is no cure for asthma, but medicines and lifestyle changes can help to control it. What are the causes? It is not known exactly what causes asthma, but certain things can cause asthma symptoms to get worse (triggers). What can trigger an asthma attack? Cigarette smoke. Mold. Dust. Your pet's skin flakes (dander). Cockroaches. Pollen. Air pollution (like household cleaners, wood smoke, smog, or chemical odors). What are the signs or symptoms? Trouble breathing (shortness of breath). Coughing. Making high-pitched whistling sounds when you breathe, most often when you breathe out (wheezing). Chest tightness. Tiredness with little activity. Poor exercise tolerance. How is this treated? Controller medicines that help prevent asthma symptoms. Fast-acting reliever or rescue medicines. These give short-term relief of asthma symptoms. Allergy medicines if your attacks are brought on by allergens. Medicines to help control the body's defense (immune) system. Staying away from the things that cause asthma attacks. Follow these instructions at home: Avoiding triggers in your home Do not allow anyone to smoke in your home. Limit use of fireplaces and wood stoves. Get rid of pests (such as roaches and mice) and their droppings. Keep your home clean. Clean your floors. Dust regularly. Use cleaning products that do not smell. Wash bed sheets and blankets every week in hot water. Dry them in a dryer. Have someone vacuum when you are not home. Change your heating and air conditioning filters often. Use blankets that are made of polyester or cotton. General  instructions Take over-the-counter and prescription medicines only as told by your doctor. Do not smoke or use any products that contain nicotine or tobacco. If you need help quitting, ask your doctor. Stay away from secondhand smoke. Avoid doing things outdoors when allergen counts are high and when air quality is low. Warm up before you exercise. Take time to cool down after exercise. Use a peak flow meter as told by your doctor. A peak flow meter is a tool that measures how well your lungs are working. Keep track of the peak flow meter's readings. Write them down. Follow your asthma action plan. This is a written plan for taking care of your asthma and treating your attacks. Make sure you get all the shots (vaccines) that your doctor recommends. Ask your doctor about a flu shot and a pneumonia shot. Keep all follow-up visits. Contact a doctor if: You have wheezing, shortness of breath, or a cough even while taking medicine to prevent attacks. The mucus you cough up (sputum) is thicker than usual. The mucus you cough up changes from clear or white to yellow, green, gray, or is bloody. You have problems from the medicine you are taking, such as: A rash. Itching. Swelling. Trouble breathing. You need reliever medicines more than 2-3 times a week. Your peak flow reading is still at 50-79% of your personal best after following the action plan for 1 hour. You have a fever. Get help right away if: You seem to be worse and are not responding to medicine during an asthma attack. You are short of breath even at rest. You get short of breath when doing very little activity. You have trouble eating, drinking, or talking. You have chest   pain or tightness. You have a fast heartbeat. Your lips or fingernails start to turn blue. You are light-headed or dizzy, or you faint. Your peak flow is less than 50% of your personal best. You feel too tired to breathe normally. These symptoms may be an  emergency. Get help right away. Call 911. Do not wait to see if the symptoms will go away. Do not drive yourself to the hospital. Summary Asthma is a long-term (chronic) condition in which the airways get tight and narrow. An asthma attack can make it hard to breathe. Asthma cannot be cured, but medicines and lifestyle changes can help control it. Make sure you understand how to avoid triggers and how and when to use your medicines. Avoid things that can cause allergy symptoms (allergens). These include animal skin flakes (dander) and pollen from trees or grass. Avoid things that pollute the air. These may include household cleaners, wood smoke, smog, or chemical odors. This information is not intended to replace advice given to you by your health care provider. Make sure you discuss any questions you have with your health care provider. Document Revised: 05/05/2021 Document Reviewed: 05/05/2021 Elsevier Patient Education  2023 [employer] Inc.  

## 2021-12-24 NOTE — Progress Notes (Signed)
? ?Subjective:  ? ? Patient ID: Deborah Osborne, female    DOB: [birth year], 51 y.o.   MRN: [id number] ? ? ?Chief Complaint: Medical Management of Chronic Issues ?  ? ?HPI: ? ?Deborah Osborne is a 51 y.o. who identifies as a female who was assigned female at birth.  ? ?Social history: ?Lives with: husband ?Work history: Deborah Osborne ? ? ?Comes in today for follow up of the following chronic medical issues: ? ?1. Mixed hyperlipidemia ?Tries to watch diet and does no dedicated exercise. ?Lab Results  ?Component Value Date  ? CHOL 184 06/26/2021  ? HDL 34 (L) 06/26/2021  ? LDLCALC 104 (H) 06/26/2021  ? TRIG 271 (H) 06/26/2021  ? CHOLHDL 5.4 (H) 06/26/2021  ? ? ? ?2. Gastroesophageal reflux disease without esophagitis ?Is on omperazole daily and is working well for her. ? ?3. Constipation, unspecified constipation type ?Is better. She had colonoscopy yesterday and they removed 5 polyps. ? ?4. Mild intermittent asthma without complication ?Asthma Control Test ? ?In the past 4 week: ? ?- How much time did you asthma keep you from getting as much done at work, school or at home? ?-1- All the time ?-2 - Most of the time ?-3- Some of the time ?-4- A little of the time ?-5- None of the time ? Response 3 ? ?- How often have you had shortness  of breath? ?-1- More than once a day ?-2- Once a day ?-3- 3-6 X a week ?-4- Once or twice a week ?-5- Not at all ?Response 4 ? ?-How often did your asthma symptoms wake you up at night or earlier then usual ?-1- 4 or more nights a week ?-2 2-3 nights a week ?-3- Once a week ?-4- Once or twice ?-5- not at all ?Response 3 ? ?-How often have you used your rescue inhaler or nebulizer medication? ?-1- 3 or more X per day ?-2- 1-2 X per day ?-3- 2-3 X per week ?-4- once a week or less ?-5- Not at all ?Response 5 ? ?-How would you rate your asthma control? ?-1- Not controlled at all ?-2- Poorly controlled ?-3- Somewhat controlled ?-4- Well controlled ?-5- Completely controlled ?Response 3 ? ?Total : 18-  moderate control ? ? ?5. hypothyroidism ?No problems that aware of ?Lab Results  ?Component Value Date  ? TSH 5.270 (H) 06/26/2021  ? ? ? ?6. Tobacco use ?Smokes over a pack a day. ? ?7. BMI 25.0-25.9,adult ?Weight is down 8lbs ?Wt Readings from Last 3 Encounters:  ?12/24/21 148 lb (67.1 kg)  ?06/26/21 156 lb (70.8 kg)  ?12/24/20 153 lb (69.4 kg)  ? ?BMI Readings from Last 3 Encounters:  ?12/24/21 25.40 kg/m?  ?06/26/21 26.78 kg/m?  ?12/24/20 26.26 kg/m?  ? ? ? ? ?New complaints: ?None today ? ?No Known Allergies ?Outpatient Encounter Medications as of 12/24/2021  ?Medication Sig  ? albuterol (PROVENTIL) (2.5 MG/3ML) 0.083% nebulizer solution Take 3 mLs (2.5 mg total) by nebulization every 6 (six) hours as needed for wheezing or shortness of breath.  ? Albuterol Sulfate (PROAIR RESPICLICK) 702 (90 Base) MCG/ACT AEPB Inhale 1-2 puffs into the lungs 4 (four) times daily as needed (shob, wheezing). prn  ? fenofibrate (TRICOR) 145 MG tablet Take 1 tablet (145 mg total) by mouth daily.  ? levothyroxine (SYNTHROID) 50 MCG tablet Take 1 tablet (50 mcg total) by mouth daily.  ? Multiple Vitamins-Minerals (CENTRUM ADULTS PO) Take by mouth.  ? omeprazole (PRILOSEC) 20 MG capsule Take 1 capsule (20 mg total)  by mouth daily.  ? pravastatin (PRAVACHOL) 80 MG tablet Take 1 tablet (80 mg total) by mouth daily.  ? ?No facility-administered encounter medications on file as of 12/24/2021.  ? ? ?Past Surgical History:  ?Procedure Laterality Date  ? CHOLECYSTECTOMY  2017  ? TUBAL LIGATION    ? ? ?Family History  ?Problem Relation Age of Onset  ? Arthritis Mother   ? Cancer Mother   ?     ???  ? Diabetes Mother   ? Vision loss Mother   ? Arthritis Father   ? Early death Father   ? Heart disease Father   ? Stroke Father   ? Colon polyps Sister   ?     Age 44s, large colon polyp requiring resection, advised to have her sibling complete colonoscopy  ? Arthritis Maternal Grandmother   ? Colon cancer Neg Hx   ? Breast cancer Neg Hx    ? ? ? ? ?Controlled substance contract: n/a ? ? ? ? ?Review of Systems  ?Constitutional:  Negative for diaphoresis.  ?Eyes:  Negative for pain.  ?Respiratory:  Negative for shortness of breath.   ?Cardiovascular:  Negative for chest pain, palpitations and leg swelling.  ?Gastrointestinal:  Negative for abdominal pain.  ?Endocrine: Negative for polydipsia.  ?Skin:  Negative for rash.  ?Neurological:  Negative for dizziness, weakness and headaches.  ?Hematological:  Does not bruise/bleed easily.  ?All other systems reviewed and are negative. ? ?   ?Objective:  ? Physical Exam ?Vitals and nursing note reviewed.  ?Constitutional:   ?   General: She is not in acute distress. ?   Appearance: Normal appearance. She is well-developed.  ?HENT:  ?   Head: Normocephalic.  ?   Right Ear: Tympanic membrane normal.  ?   Left Ear: Tympanic membrane normal.  ?   Nose: Nose normal.  ?   Mouth/Throat:  ?   Mouth: Mucous membranes are moist.  ?Eyes:  ?   Pupils: Pupils are equal, round, and reactive to light.  ?Neck:  ?   Vascular: No carotid bruit or JVD.  ?Cardiovascular:  ?   Rate and Rhythm: Normal rate and regular rhythm.  ?   Heart sounds: Normal heart sounds.  ?Pulmonary:  ?   Effort: Pulmonary effort is normal. No respiratory distress.  ?   Breath sounds: Normal breath sounds. No wheezing or rales.  ?Chest:  ?   Chest wall: No tenderness.  ?Abdominal:  ?   General: Bowel sounds are normal. There is no distension or abdominal bruit.  ?   Palpations: Abdomen is soft. There is no hepatomegaly, splenomegaly, mass or pulsatile mass.  ?   Tenderness: There is no abdominal tenderness.  ?Musculoskeletal:     ?   General: Normal range of motion.  ?   Cervical back: Normal range of motion and neck supple.  ?Lymphadenopathy:  ?   Cervical: No cervical adenopathy.  ?Skin: ?   General: Skin is warm and dry.  ?Neurological:  ?   Mental Status: She is alert and oriented to person, place, and time.  ?   Deep Tendon Reflexes: Reflexes are  normal and symmetric.  ?Psychiatric:     ?   Behavior: Behavior normal.     ?   Thought Content: Thought content normal.     ?   Judgment: Judgment normal.  ? ?BP (!) 151/88   Pulse 66   Temp 97.9 ?F (36.6 ?C) (Temporal)   Resp 20  Ht _0  (1.626 m)   Wt 148 lb (67.1 kg)   SpO2 98%   BMI 25.40 kg/m?  ? ? ? ? ?   ?Assessment & Plan:  ? ?Sylvi Scalisi comes in today with chief complaint of Medical Management of Chronic Issues ? ? ?Diagnosis and orders addressed: ? ?1. Mixed hyperlipidemia ?Low fat diet ?- CBC with Differential/Platelet ?- CMP14+EGFR ?- Lipid panel ?- pravastatin (PRAVACHOL) 80 MG tablet; Take 1 tablet (80 mg total) by mouth daily.  Dispense: 90 tablet; Refill: 1 ?- fenofibrate (TRICOR) 145 MG tablet; Take 1 tablet (145 mg total) by mouth daily.  Dispense: 90 tablet; Refill: 1 ? ?2. Gastroesophageal reflux disease without esophagitis ?Avoid spicy foods ?Do not eat 2 hours prior to bedtime ?- omeprazole (PRILOSEC) 20 MG capsule; Take 1 capsule (20 mg total) by mouth daily.  Dispense: 90 capsule; Refill: 1 ? ?3. Constipation, unspecified constipation type ?Miralax daily ? ?4. Mild intermittent asthma without complication ?Moderate asthma control ? ?5. Tobacco use ?Smoking cessation encouraged ?- CT CHEST LUNG CA SCREEN LOW DOSE W/O CM; Future ? ?6. BMI 25.0-25.9,adult ?Discussed diet and exercise for person with BMI >25 ?Will recheck weight in 3-6 months ? ? ?7. Acquired hypothyroidism ?Labs pending ?- Thyroid Panel With TSH ? ? ?Labs pending ?Health Maintenance reviewed ?Diet and exercise encouraged ? ?Follow up plan: ?6 months ? ? ?Mary-Margaret Hassell Done, FNP ? ?

## 2021-12-24 NOTE — Addendum Note (Signed)
Addended by: Rolena Infante on: 12/24/2021 02:35 PM ? ? Modules accepted: Orders ? ?

## 2021-12-25 LAB — THYROID PANEL WITH TSH
Free Thyroxine Index: 3.1 (ref 1.2–4.9)
T3 Uptake Ratio: 30 % (ref 24–39)
T4, Total: 10.2 ug/dL (ref 4.5–12.0)
TSH: 2.76 u[IU]/mL (ref 0.450–4.500)

## 2021-12-25 LAB — CMP14+EGFR
ALT: 29 IU/L (ref 0–32)
AST: 41 IU/L — ABNORMAL HIGH (ref 0–40)
Albumin/Globulin Ratio: 1.7 (ref 1.2–2.2)
Albumin: 4.7 g/dL (ref 3.8–4.8)
Alkaline Phosphatase: 61 IU/L (ref 44–121)
BUN/Creatinine Ratio: 8 — ABNORMAL LOW (ref 9–23)
BUN: 8 mg/dL (ref 6–24)
Bilirubin Total: 0.2 mg/dL (ref 0.0–1.2)
CO2: 21 mmol/L (ref 20–29)
Calcium: 10.3 mg/dL — ABNORMAL HIGH (ref 8.7–10.2)
Chloride: 103 mmol/L (ref 96–106)
Creatinine, Ser: 0.97 mg/dL (ref 0.57–1.00)
Globulin, Total: 2.7 g/dL (ref 1.5–4.5)
Glucose: 93 mg/dL (ref 70–99)
Potassium: 4.9 mmol/L (ref 3.5–5.2)
Sodium: 141 mmol/L (ref 134–144)
Total Protein: 7.4 g/dL (ref 6.0–8.5)
eGFR: 71 mL/min/{1.73_m2} (ref >59)

## 2021-12-25 LAB — LIPID PANEL
Chol/HDL Ratio: 4.6 ratio — ABNORMAL HIGH (ref 0.0–4.4)
Cholesterol, Total: 182 mg/dL (ref 100–199)
HDL: 40 mg/dL (ref >39)
LDL Chol Calc (NIH): 102 mg/dL — ABNORMAL HIGH (ref 0–99)
Triglycerides: 233 mg/dL — ABNORMAL HIGH (ref 0–149)
VLDL Cholesterol Cal: 40 mg/dL (ref 5–40)

## 2021-12-25 LAB — CBC WITH DIFFERENTIAL/PLATELET
Basophils Absolute: 0 10*3/uL (ref 0.0–0.2)
Basos: 0 %
EOS (ABSOLUTE): 0.1 10*3/uL (ref 0.0–0.4)
Eos: 1 %
Hematocrit: 42 % (ref 34.0–46.6)
Hemoglobin: 14.2 g/dL (ref 11.1–15.9)
Immature Grans (Abs): 0 10*3/uL (ref 0.0–0.1)
Immature Granulocytes: 0 %
Lymphocytes Absolute: 3.4 10*3/uL — ABNORMAL HIGH (ref 0.7–3.1)
Lymphs: 31 %
MCH: 30.1 pg (ref 26.6–33.0)
MCHC: 33.8 g/dL (ref 31.5–35.7)
MCV: 89 fL (ref 79–97)
Monocytes Absolute: 0.9 10*3/uL (ref 0.1–0.9)
Monocytes: 8 %
Neutrophils Absolute: 6.6 10*3/uL (ref 1.4–7.0)
Neutrophils: 60 %
Platelets: 384 10*3/uL (ref 150–450)
RBC: 4.71 x10E6/uL (ref 3.77–5.28)
RDW: 13 % (ref 11.7–15.4)
WBC: 11 10*3/uL — ABNORMAL HIGH (ref 3.4–10.8)

## 2022-05-07 ENCOUNTER — Ambulatory Visit (INDEPENDENT_AMBULATORY_CARE_PROVIDER_SITE_OTHER): Payer: 59 | Admitting: *Deleted

## 2022-05-07 DIAGNOSIS — Z23 Encounter for immunization: Secondary | ICD-10-CM

## 2022-05-13 ENCOUNTER — Encounter: Payer: Self-pay | Admitting: Family Medicine

## 2022-05-13 ENCOUNTER — Ambulatory Visit (INDEPENDENT_AMBULATORY_CARE_PROVIDER_SITE_OTHER): Payer: 59 | Admitting: Family Medicine

## 2022-05-13 VITALS — BP 154/90 | HR 76 | Temp 98.6°F | Ht 64.0 in | Wt 148.0 lb

## 2022-05-13 DIAGNOSIS — J069 Acute upper respiratory infection, unspecified: Secondary | ICD-10-CM

## 2022-05-13 MED ORDER — PSEUDOEPH-BROMPHEN-DM 30-2-10 MG/5ML PO SYRP: 5.0000 mL | ORAL_SOLUTION | Freq: Four times a day (QID) | ORAL | 0 refills | Status: DC | PRN

## 2022-05-13 MED ORDER — AZITHROMYCIN 250 MG PO TABS: ORAL_TABLET | ORAL | 0 refills | Status: DC

## 2022-05-13 NOTE — Progress Notes (Signed)
Subjective:  Patient ID: Deborah Osborne, female    DOB: 08-02-1971, 51 y.o.   MRN: [id number]  Patient Care Team: Chevis Pretty, FNP as PCP - General (Family Medicine) Danie Binder, MD (Inactive) as Consulting Physician (Gastroenterology) [facility]   Chief Complaint:  Sore Throat (dysphasia)   HPI: Deborah Osborne is a 51 y.o. female presenting on 05/13/2022 for Sore Throat (dysphasia)   URI  This is a new problem. Episode onset: 6 days ago. The problem has been gradually worsening. Associated symptoms include congestion, coughing, rhinorrhea, a sore throat and wheezing. Pertinent negatives include no abdominal pain, chest pain, diarrhea, dysuria, ear pain, headaches, joint pain, joint swelling, nausea, neck pain, plugged ear sensation, rash, sinus pain, sneezing, swollen glands or vomiting. She has tried acetaminophen for the symptoms. The treatment provided no relief.    Relevant past medical, surgical, family, and social history reviewed and updated as indicated.  Allergies and medications reviewed and updated. Data reviewed: Chart in Epic.   Past Medical History:  Diagnosis Date   Asthma    Colon polyps 01/16/2016   GERD (gastroesophageal reflux disease)    Hyperlipidemia     Past Surgical History:  Procedure Laterality Date   CHOLECYSTECTOMY  2017   TUBAL LIGATION      Social History   Socioeconomic History   Marital status: Married    Spouse name: Not on file   Number of children: Not on file   Years of education: Not on file   Highest education level: Not on file  Occupational History   Not on file  Tobacco Use   Smoking status: Every Day    Packs/day: 1.50    Years: 20.00    Total pack years: 30.00    Types: Cigarettes   Smokeless tobacco: Never  Vaping Use   Vaping Use: Never used  Substance and Sexual Activity   Alcohol use: No   Drug use: No   Sexual activity: Not on file  Other Topics Concern   Not on file  Social  History Narrative   Not on file   Social Determinants of Health   Financial Resource Strain: Not on file  Food Insecurity: Not on file  Transportation Needs: Not on file  Physical Activity: Not on file  Stress: Not on file  Social Connections: Not on file  Intimate Partner Violence: Not on file    Outpatient Encounter Medications as of 05/13/2022  Medication Sig   azithromycin (ZITHROMAX Z-PAK) 250 MG tablet As directed   brompheniramine-pseudoephedrine-DM 30-2-10 MG/5ML syrup Take 5 mLs by mouth 4 (four) times daily as needed.   albuterol (PROVENTIL) (2.5 MG/3ML) 0.083% nebulizer solution Take 3 mLs (2.5 mg total) by nebulization every 6 (six) hours as needed for wheezing or shortness of breath.   Albuterol Sulfate (PROAIR RESPICLICK) 542 (90 Base) MCG/ACT AEPB Inhale 1-2 puffs into the lungs 4 (four) times daily as needed (shob, wheezing). prn   fenofibrate (TRICOR) 145 MG tablet Take 1 tablet (145 mg total) by mouth daily.   levothyroxine (SYNTHROID) 50 MCG tablet Take 1 tablet (50 mcg total) by mouth daily.   Multiple Vitamins-Minerals (CENTRUM ADULTS PO) Take by mouth.   omeprazole (PRILOSEC) 20 MG capsule Take 1 capsule (20 mg total) by mouth daily.   pravastatin (PRAVACHOL) 80 MG tablet Take 1 tablet (80 mg total) by mouth daily.   No facility-administered encounter medications on file as of 05/13/2022.    No Known Allergies  Review of Systems  Constitutional:  Positive for activity change, appetite change, chills, fatigue and fever. Negative for diaphoresis and unexpected weight change.  HENT:  Positive for congestion, postnasal drip, rhinorrhea, sore throat and voice change. Negative for dental problem, drooling, ear discharge, ear pain, facial swelling, hearing loss, mouth sores, nosebleeds, sinus pressure, sinus pain, sneezing, tinnitus and trouble swallowing.   Eyes:  Negative for photophobia and visual disturbance.  Respiratory:  Positive for cough and wheezing. Negative  for apnea, choking, chest tightness, shortness of breath and stridor.   Cardiovascular:  Negative for chest pain.  Gastrointestinal:  Negative for abdominal distention, abdominal pain, anal bleeding, blood in stool, constipation, diarrhea, nausea and vomiting.  Genitourinary:  Negative for decreased urine volume, difficulty urinating and dysuria.  Musculoskeletal:  Negative for joint pain and neck pain.  Skin:  Negative for rash.  Neurological:  Negative for dizziness, tremors, seizures, syncope, facial asymmetry, speech difficulty, weakness, light-headedness, numbness and headaches.  Psychiatric/Behavioral:  Negative for confusion.         Objective:  BP (!) 154/90   Pulse 76   Temp 98.6 F (37 C)   Ht 5' 4"  (1.626 m)   Wt 148 lb (67.1 kg)   SpO2 95%   BMI 25.40 kg/m    Wt Readings from Last 3 Encounters:  05/13/22 148 lb (67.1 kg)  12/24/21 148 lb (67.1 kg)  06/26/21 156 lb (70.8 kg)    Physical Exam  Results for orders placed or performed in visit on 12/24/21  CBC with Differential/Platelet  Result Value Ref Range   WBC 11.0 (H) 3.4 - 10.8 x10E3/uL   RBC 4.71 3.77 - 5.28 x10E6/uL   Hemoglobin 14.2 11.1 - 15.9 g/dL   Hematocrit 42.0 34.0 - 46.6 %   MCV 89 79 - 97 fL   MCH 30.1 26.6 - 33.0 pg   MCHC 33.8 31.5 - 35.7 g/dL   RDW 13.0 11.7 - 15.4 %   Platelets 384 150 - 450 x10E3/uL   Neutrophils 60 Not Estab. %   Lymphs 31 Not Estab. %   Monocytes 8 Not Estab. %   Eos 1 Not Estab. %   Basos 0 Not Estab. %   Neutrophils Absolute 6.6 1.4 - 7.0 x10E3/uL   Lymphocytes Absolute 3.4 (H) 0.7 - 3.1 x10E3/uL   Monocytes Absolute 0.9 0.1 - 0.9 x10E3/uL   EOS (ABSOLUTE) 0.1 0.0 - 0.4 x10E3/uL   Basophils Absolute 0.0 0.0 - 0.2 x10E3/uL   Immature Granulocytes 0 Not Estab. %   Immature Grans (Abs) 0.0 0.0 - 0.1 x10E3/uL  CMP14+EGFR  Result Value Ref Range   Glucose 93 70 - 99 mg/dL   BUN 8 6 - 24 mg/dL   Creatinine, Ser 0.97 0.57 - 1.00 mg/dL   eGFR 71 >59 mL/min/1.73    BUN/Creatinine Ratio 8 (L) 9 - 23   Sodium 141 134 - 144 mmol/L   Potassium 4.9 3.5 - 5.2 mmol/L   Chloride 103 96 - 106 mmol/L   CO2 21 20 - 29 mmol/L   Calcium 10.3 (H) 8.7 - 10.2 mg/dL   Total Protein 7.4 6.0 - 8.5 g/dL   Albumin 4.7 3.8 - 4.8 g/dL   Globulin, Total 2.7 1.5 - 4.5 g/dL   Albumin/Globulin Ratio 1.7 1.2 - 2.2   Bilirubin Total 0.2 0.0 - 1.2 mg/dL   Alkaline Phosphatase 61 44 - 121 IU/L   AST 41 (H) 0 - 40 IU/L   ALT 29 0 - 32 IU/L  Lipid panel  Result Value Ref Range   Cholesterol, Total 182 100 - 199 mg/dL   Triglycerides 233 (H) 0 - 149 mg/dL   HDL 40 >39 mg/dL   VLDL Cholesterol Cal 40 5 - 40 mg/dL   LDL Chol Calc (NIH) 102 (H) 0 - 99 mg/dL   Chol/HDL Ratio 4.6 (H) 0.0 - 4.4 ratio  Thyroid Panel With TSH  Result Value Ref Range   TSH 2.760 0.450 - 4.500 uIU/mL   T4, Total 10.2 4.5 - 12.0 ug/dL   T3 Uptake Ratio 30 24 - 39 %   Free Thyroxine Index 3.1 1.2 - 4.9       Pertinent labs & imaging results that were available during my care of the patient were reviewed by me and considered in my medical decision making.  Assessment & Plan:  Deborah Osborne was seen today for sore throat.  Diagnoses and all orders for this visit:  URI with cough and congestion Ongoing symptoms despite symptomatic care at home. Will place on Zithromax along with Bromfed for symptomatic relief. Pt aware of symptomatic care at home including tea with honey and throat lozenges. Report new, worsening, or persistent symptoms.  -     brompheniramine-pseudoephedrine-DM 30-2-10 MG/5ML syrup; Take 5 mLs by mouth 4 (four) times daily as needed. -     azithromycin (ZITHROMAX Z-PAK) 250 MG tablet; As directed    Continue all other maintenance medications.  Follow up plan: Return if symptoms worsen or fail to improve.   Continue healthy lifestyle choices, including diet (rich in fruits, vegetables, and lean proteins, and low in salt and simple carbohydrates) and exercise (at least 30 minutes  of moderate physical activity daily).  Educational handout given for URI  The above assessment and management plan was discussed with the patient. The patient verbalized understanding of and has agreed to the management plan. Patient is aware to call the clinic if they develop any new symptoms or if symptoms persist or worsen. Patient is aware when to return to the clinic for a follow-up visit. Patient educated on when it is appropriate to go to the emergency department.   Monia Pouch, FNP-C [facility] [phone]

## 2022-06-22 ENCOUNTER — Other Ambulatory Visit: Payer: Self-pay | Admitting: Nurse Practitioner

## 2022-06-22 DIAGNOSIS — E039 Hypothyroidism, unspecified: Secondary | ICD-10-CM

## 2022-06-26 ENCOUNTER — Encounter: Payer: Self-pay | Admitting: Nurse Practitioner

## 2022-06-26 ENCOUNTER — Ambulatory Visit (INDEPENDENT_AMBULATORY_CARE_PROVIDER_SITE_OTHER): Payer: 59 | Admitting: Nurse Practitioner

## 2022-06-26 VITALS — BP 132/80 | HR 75 | Temp 97.7°F | Resp 20 | Ht 64.0 in | Wt 152.0 lb

## 2022-06-26 DIAGNOSIS — Z23 Encounter for immunization: Secondary | ICD-10-CM | POA: Diagnosis not present

## 2022-06-26 DIAGNOSIS — Z720 Tobacco use: Secondary | ICD-10-CM | POA: Diagnosis not present

## 2022-06-26 DIAGNOSIS — K5900 Constipation, unspecified: Secondary | ICD-10-CM | POA: Diagnosis not present

## 2022-06-26 DIAGNOSIS — E039 Hypothyroidism, unspecified: Secondary | ICD-10-CM

## 2022-06-26 DIAGNOSIS — E782 Mixed hyperlipidemia: Secondary | ICD-10-CM

## 2022-06-26 DIAGNOSIS — J4520 Mild intermittent asthma, uncomplicated: Secondary | ICD-10-CM

## 2022-06-26 DIAGNOSIS — Z6826 Body mass index (BMI) 26.0-26.9, adult: Secondary | ICD-10-CM | POA: Diagnosis not present

## 2022-06-26 DIAGNOSIS — K219 Gastro-esophageal reflux disease without esophagitis: Secondary | ICD-10-CM

## 2022-06-26 MED ORDER — OMEPRAZOLE 20 MG PO CPDR: 20.0000 mg | DELAYED_RELEASE_CAPSULE | Freq: Every day | ORAL | 1 refills | Status: DC

## 2022-06-26 MED ORDER — FENOFIBRATE 145 MG PO TABS: 145.0000 mg | ORAL_TABLET | Freq: Every day | ORAL | 1 refills | Status: DC

## 2022-06-26 MED ORDER — PRAVASTATIN SODIUM 80 MG PO TABS: 80.0000 mg | ORAL_TABLET | Freq: Every day | ORAL | 1 refills | Status: DC

## 2022-06-26 MED ORDER — LEVOTHYROXINE SODIUM 50 MCG PO TABS: 50.0000 ug | ORAL_TABLET | Freq: Every day | ORAL | 1 refills | Status: DC

## 2022-06-26 NOTE — Patient Instructions (Signed)

## 2022-06-26 NOTE — Progress Notes (Signed)
Subjective:    Patient ID: Deborah Osborne, female    DOB: 1970-09-19, 51 y.o.   MRN: [id number]   Chief Complaint: medical management of chronic issues     HPI:  Deborah Osborne is a 51 y.o. who identifies as a female who was assigned female at birth.   Social history: Lives with: husband Work history: painter   Comes in today for follow up of the following chronic medical issues:  1. Mixed hyperlipidemia Does try to watch diet but does no dedicated exercise. Lab Results  Component Value Date   CHOL 182 12/24/2021   HDL 40 12/24/2021   LDLCALC 102 (H) 12/24/2021   TRIG 233 (H) 12/24/2021   CHOLHDL 4.6 (H) 12/24/2021   The 10-year ASCVD risk score (Arnett DK, et al., 2019) is: 4.9%   2. Gastroesophageal reflux disease without esophagitis Is on omeprazole daily and is doing well.  3. Acquired hypothyroidism No problems that she is aware of. Lab Results  Component Value Date   TSH 2.760 12/24/2021     4. Mild intermittent asthma without complication Iso n no maintenance inhaler. Uses albuterol only as needed and has not used in several months   5. Constipation, unspecified constipation type Better. No issues lately  6. Tobacco use Smokes 1 1/2 packs a day  7. BMI 25.0-25.9,adult Weight is up 4lbs Wt Readings from Last 3 Encounters:  06/26/22 152 lb (68.9 kg)  05/13/22 148 lb (67.1 kg)  12/24/21 148 lb (67.1 kg)   BMI Readings from Last 3 Encounters:  06/26/22 26.09 kg/m  05/13/22 25.40 kg/m  12/24/21 25.40 kg/m     New complaints: None today  No Known Allergies Outpatient Encounter Medications as of 06/26/2022  Medication Sig   albuterol (PROVENTIL) (2.5 MG/3ML) 0.083% nebulizer solution Take 3 mLs (2.5 mg total) by nebulization every 6 (six) hours as needed for wheezing or shortness of breath.   Albuterol Sulfate (PROAIR RESPICLICK) 833 (90 Base) MCG/ACT AEPB Inhale 1-2 puffs into the lungs 4 (four) times daily as needed (shob, wheezing). prn    azithromycin (ZITHROMAX Z-PAK) 250 MG tablet As directed   brompheniramine-pseudoephedrine-DM 30-2-10 MG/5ML syrup Take 5 mLs by mouth 4 (four) times daily as needed.   fenofibrate (TRICOR) 145 MG tablet Take 1 tablet (145 mg total) by mouth daily.   levothyroxine (SYNTHROID) 50 MCG tablet Take 1 tablet by mouth once daily   Multiple Vitamins-Minerals (CENTRUM ADULTS PO) Take by mouth.   omeprazole (PRILOSEC) 20 MG capsule Take 1 capsule (20 mg total) by mouth daily.   pravastatin (PRAVACHOL) 80 MG tablet Take 1 tablet (80 mg total) by mouth daily.   No facility-administered encounter medications on file as of 06/26/2022.    Past Surgical History:  Procedure Laterality Date   CHOLECYSTECTOMY  2017   TUBAL LIGATION      Family History  Problem Relation Age of Onset   Arthritis Mother    Cancer Mother        ???   Diabetes Mother    Vision loss Mother    Arthritis Father    Early death Father    Heart disease Father    Stroke Father    Colon polyps Sister        Age 70s, large colon polyp requiring resection, advised to have her sibling complete colonoscopy   Arthritis Maternal Grandmother    Colon cancer Neg Hx    Breast cancer Neg Hx       Controlled substance contract:  n/a     Review of Systems  Constitutional:  Negative for diaphoresis.  Eyes:  Negative for pain.  Respiratory:  Negative for shortness of breath.   Cardiovascular:  Negative for chest pain, palpitations and leg swelling.  Gastrointestinal:  Negative for abdominal pain.  Endocrine: Negative for polydipsia.  Skin:  Negative for rash.  Neurological:  Negative for dizziness, weakness and headaches.  Hematological:  Does not bruise/bleed easily.  All other systems reviewed and are negative.      Objective:   Physical Exam Vitals and nursing note reviewed.  Constitutional:      General: She is not in acute distress.    Appearance: Normal appearance. She is well-developed.  HENT:     Head:  Normocephalic.     Right Ear: Tympanic membrane normal.     Left Ear: Tympanic membrane normal.     Nose: Nose normal.     Mouth/Throat:     Mouth: Mucous membranes are moist.  Eyes:     Pupils: Pupils are equal, round, and reactive to light.  Neck:     Vascular: No carotid bruit or JVD.  Cardiovascular:     Rate and Rhythm: Normal rate and regular rhythm.     Heart sounds: Normal heart sounds.  Pulmonary:     Effort: Pulmonary effort is normal. No respiratory distress.     Breath sounds: Normal breath sounds. No wheezing or rales.  Chest:     Chest wall: No tenderness.  Abdominal:     General: Bowel sounds are normal. There is no distension or abdominal bruit.     Palpations: Abdomen is soft. There is no hepatomegaly, splenomegaly, mass or pulsatile mass.     Tenderness: There is no abdominal tenderness.  Musculoskeletal:        General: Normal range of motion.     Cervical back: Normal range of motion and neck supple.  Lymphadenopathy:     Cervical: No cervical adenopathy.  Skin:    General: Skin is warm and dry.  Neurological:     Mental Status: She is alert and oriented to person, place, and time.     Deep Tendon Reflexes: Reflexes are normal and symmetric.  Psychiatric:        Behavior: Behavior normal.        Thought Content: Thought content normal.        Judgment: Judgment normal.     BP 132/80   Pulse 75   Temp 97.7 F (36.5 C) (Temporal)   Resp 20   Ht _0  (1.626 m)   Wt 152 lb (68.9 kg)   SpO2 97%   BMI 26.09 kg/m        Assessment & Plan:   [name] comes in today with chief complaint of Medical Management of Chronic Issues   Diagnosis and orders addressed:  1. Mixed hyperlipidemia Low fat diet - fenofibrate (TRICOR) 145 MG tablet; Take 1 tablet (145 mg total) by mouth daily.  Dispense: 90 tablet; Refill: 1 - pravastatin (PRAVACHOL) 80 MG tablet; Take 1 tablet (80 mg total) by mouth daily.  Dispense: 90 tablet; Refill: 1 - CBC with  Differential/Platelet - CMP14+EGFR - Lipid panel  2. Gastroesophageal reflux disease without esophagitis Avoid spicy foods Do not eat 2 hours prior to bedtime  - omeprazole (PRILOSEC) 20 MG capsule; Take 1 capsule (20 mg total) by mouth daily.  Dispense: 90 capsule; Refill: 1  3. Acquired hypothyroidism Labs pending - Thyroid Panel With TSH  levothyroxine (SYNTHROID) 50 MCG tablet; Take 1 tablet (50 mcg total) by mouth daily.  Dispense: 90 tablet; Refill: 1  4. Mild intermittent asthma without complication Report any wheezing or sob  5. Constipation, unspecified constipation type Increase fiber in diet  6. Tobacco use Smoking cessation encouraged - CT CHEST LUNG CA SCREEN LOW DOSE W/O CM; Future  7. BMI 26.0-26.9,adult Discussed diet and exercise for person with BMI >25 Will recheck weight in 3-6 months   -   Labs pending Health Maintenance reviewed Diet and exercise encouraged  Follow up plan: 6 months   Winter, FNP

## 2022-06-27 LAB — THYROID PANEL WITH TSH
Free Thyroxine Index: 2.6 (ref 1.2–4.9)
T3 Uptake Ratio: 26 % (ref 24–39)
T4, Total: 10.1 ug/dL (ref 4.5–12.0)
TSH: 3.44 u[IU]/mL (ref 0.450–4.500)

## 2022-06-27 LAB — CBC WITH DIFFERENTIAL/PLATELET
Basophils Absolute: 0.1 10*3/uL (ref 0.0–0.2)
Basos: 0 %
EOS (ABSOLUTE): 0.1 10*3/uL (ref 0.0–0.4)
Eos: 1 %
Hematocrit: 41.7 % (ref 34.0–46.6)
Hemoglobin: 13.7 g/dL (ref 11.1–15.9)
Immature Grans (Abs): 0 10*3/uL (ref 0.0–0.1)
Immature Granulocytes: 0 %
Lymphocytes Absolute: 3.8 10*3/uL — ABNORMAL HIGH (ref 0.7–3.1)
Lymphs: 24 %
MCH: 29.7 pg (ref 26.6–33.0)
MCHC: 32.9 g/dL (ref 31.5–35.7)
MCV: 90 fL (ref 79–97)
Monocytes Absolute: 1.2 10*3/uL — ABNORMAL HIGH (ref 0.1–0.9)
Monocytes: 7 %
Neutrophils Absolute: 10.9 10*3/uL — ABNORMAL HIGH (ref 1.4–7.0)
Neutrophils: 68 %
Platelets: 375 10*3/uL (ref 150–450)
RBC: 4.62 x10E6/uL (ref 3.77–5.28)
RDW: 13 % (ref 11.7–15.4)
WBC: 16.1 10*3/uL — ABNORMAL HIGH (ref 3.4–10.8)

## 2022-06-27 LAB — CMP14+EGFR
ALT: 15 IU/L (ref 0–32)
AST: 20 IU/L (ref 0–40)
Albumin/Globulin Ratio: 2 (ref 1.2–2.2)
Albumin: 4.7 g/dL (ref 3.9–4.9)
Alkaline Phosphatase: 57 IU/L (ref 44–121)
BUN/Creatinine Ratio: 14 (ref 9–23)
BUN: 14 mg/dL (ref 6–24)
Bilirubin Total: <0.2 mg/dL (ref 0.0–1.2)
CO2: 23 mmol/L (ref 20–29)
Calcium: 10.3 mg/dL — ABNORMAL HIGH (ref 8.7–10.2)
Chloride: 100 mmol/L (ref 96–106)
Creatinine, Ser: 1.03 mg/dL — ABNORMAL HIGH (ref 0.57–1.00)
Globulin, Total: 2.4 g/dL (ref 1.5–4.5)
Glucose: 82 mg/dL (ref 70–99)
Potassium: 4.9 mmol/L (ref 3.5–5.2)
Sodium: 139 mmol/L (ref 134–144)
Total Protein: 7.1 g/dL (ref 6.0–8.5)
eGFR: 66 mL/min/{1.73_m2} (ref >59)

## 2022-06-27 LAB — LIPID PANEL
Chol/HDL Ratio: 6.2 ratio — ABNORMAL HIGH (ref 0.0–4.4)
Cholesterol, Total: 185 mg/dL (ref 100–199)
HDL: 30 mg/dL — ABNORMAL LOW (ref >39)
LDL Chol Calc (NIH): 110 mg/dL — ABNORMAL HIGH (ref 0–99)
Triglycerides: 256 mg/dL — ABNORMAL HIGH (ref 0–149)
VLDL Cholesterol Cal: 45 mg/dL — ABNORMAL HIGH (ref 5–40)

## 2022-06-27 MED ORDER — ROSUVASTATIN CALCIUM 10 MG PO TABS: 10.0000 mg | ORAL_TABLET | Freq: Every day | ORAL | 3 refills | Status: DC

## 2022-06-27 NOTE — Addendum Note (Signed)
Addended by: Bennie Pierini on: 06/27/2022 07:27 AM   Modules accepted: Orders

## 2022-07-30 ENCOUNTER — Other Ambulatory Visit: Payer: Self-pay

## 2022-07-30 DIAGNOSIS — Z87891 Personal history of nicotine dependence: Secondary | ICD-10-CM

## 2022-07-30 DIAGNOSIS — F17210 Nicotine dependence, cigarettes, uncomplicated: Secondary | ICD-10-CM

## 2022-07-30 DIAGNOSIS — Z122 Encounter for screening for malignant neoplasm of respiratory organs: Secondary | ICD-10-CM

## 2022-09-15 ENCOUNTER — Encounter: Payer: Self-pay | Admitting: Acute Care

## 2022-09-15 ENCOUNTER — Ambulatory Visit (INDEPENDENT_AMBULATORY_CARE_PROVIDER_SITE_OTHER): Payer: 59 | Admitting: Acute Care

## 2022-09-15 DIAGNOSIS — R69 Illness, unspecified: Secondary | ICD-10-CM | POA: Diagnosis not present

## 2022-09-15 DIAGNOSIS — F17210 Nicotine dependence, cigarettes, uncomplicated: Secondary | ICD-10-CM | POA: Diagnosis not present

## 2022-09-15 NOTE — Progress Notes (Signed)
Virtual Visit via Telephone Note  I connected with Devita Stegman on 09/15/22 at  2:00 PM EST by telephone and verified that I am speaking with the correct person using two identifiers.  Location: Patient:  At home Provider:  [place], [place], [place], [address]    I discussed the limitations, risks, security and privacy concerns of performing an evaluation and management service by telephone and the availability of in person appointments. I also discussed with the patient that there may be a patient responsible charge related to this service. The patient expressed understanding and agreed to proceed.   Shared Decision Making Visit Lung Cancer Screening Program [phone])   Eligibility: Age 52 y.o. Pack Years Smoking History Calculation 51 pack year smoking history (# packs/per year x # years smoked) Recent History of coughing up blood  no Unexplained weight loss? no ( >Than 15 pounds within the last 6 months ) Prior History Lung / other cancer no (Diagnosis within the last 5 years already requiring surveillance chest CT Scans). Smoking Status Current Smoker Former Smokers: Years since quit:  NA  Quit Date:  NA  Visit Components: Discussion included one or more decision making aids. yes Discussion included risk/benefits of screening. yes Discussion included potential follow up diagnostic testing for abnormal scans. yes Discussion included meaning and risk of over diagnosis. yes Discussion included meaning and risk of False Positives. yes Discussion included meaning of total radiation exposure. yes  Counseling Included: Importance of adherence to annual lung cancer LDCT screening. yes Impact of comorbidities on ability to participate in the program. yes Ability and willingness to under diagnostic treatment. yes  Smoking Cessation Counseling: Current Smokers:  Discussed importance of smoking cessation. yes Information about tobacco cessation classes and  interventions provided to patient. yes Patient provided with "ticket" for LDCT Scan. yes Symptomatic Patient. no  Counseling NA Diagnosis Code: Tobacco Use Z72.0 Asymptomatic Patient yes  Counseling (Intermediate counseling: > three minutes counseling) [id number] Former Smokers:  Discussed the importance of maintaining cigarette abstinence. yes Diagnosis Code: Personal History of Nicotine Dependence. G83.662 Information about tobacco cessation classes and interventions provided to patient. Yes Patient provided with "ticket" for LDCT Scan. yes Written Order for Lung Cancer Screening with LDCT placed in Epic. Yes (CT Chest Lung Cancer Screening Low Dose W/O CM) [id number] Z12.2-Screening of respiratory organs Z87.891-Personal history of nicotine dependence  I have spent 25 minutes of face to face/ virtual visit   time with  Ms. Appelhans discussing the risks and benefits of lung cancer screening. We viewed / discussed a power point together that explained in detail the above noted topics. We paused at intervals to allow for questions to be asked and answered to ensure understanding.We discussed that the single most powerful action that she can take to decrease her risk of developing lung cancer is to quit smoking. We discussed whether or not she is ready to commit to setting a quit date. We discussed options for tools to aid in quitting smoking including nicotine replacement therapy, non-nicotine medications, support groups, Quit Smart classes, and behavior modification. We discussed that often times setting smaller, more achievable goals, such as eliminating 1 cigarette a day for a week and then 2 cigarettes a day for a week can be helpful in slowly decreasing the number of cigarettes smoked. This allows for a sense of accomplishment as well as providing a clinical benefit. I provided  her  with smoking cessation  information  with contact information for community resources, classes,  free nicotine replacement  therapy, and access to mobile apps, text messaging, and on-line smoking cessation help. I have also provided  her  the office contact information in the event she needs to contact me, or the screening staff. We discussed the time and location of the scan, and that either Doroteo Glassman RN, Joella Prince, RN  or I will call / send a letter with the results within 24-72 hours of receiving them. The patient verbalized understanding of all of  the above and had no further questions upon leaving the office. They have my contact information in the event they have any further questions.  I spent 3 minutes counseling on smoking cessation and the health risks of continued tobacco abuse.  I explained to the patient that there has been a high incidence of coronary artery disease noted on these exams. I explained that this is a non-gated exam therefore degree or severity cannot be determined. This patient is on statin therapy. I have asked the patient to follow-up with their PCP regarding any incidental finding of coronary artery disease and management with diet or medication as their PCP  feels is clinically indicated. The patient verbalized understanding of the above and had no further questions upon completion of the visit.      Magdalen Spatz, NP 09/15/2022

## 2022-09-15 NOTE — Patient Instructions (Signed)
Thank you for participating in the [facility]. It was our pleasure to meet you today. We will call you with the results of your scan within the next few days. Your scan will be assigned a Lung RADS category score by the physicians reading the scans.  This Lung RADS score determines follow up scanning.  See below for description of categories, and follow up screening recommendations. We will be in touch to schedule your follow up screening annually or based on recommendations of our providers. We will fax a copy of your scan results to your Primary Care Physician, or the physician who referred you to the program, to ensure they have the results. Please call the office if you have any questions or concerns regarding your scanning experience or results.  Our office number is [phone]. Please speak with Denise Phelps, RN. , or  Denise Buckner RN, They are  our Lung Cancer Screening RN.'s If They are unavailable when you call, Please leave a message on the voice mail. We will return your call at our earliest convenience.This voice mail is monitored several times a day.  Remember, if your scan is normal, we will scan you annually as long as you continue to meet the criteria for the program. (Age 50-80, Current smoker or smoker who has quit within the last 15 years). If you are a smoker, remember, quitting is the single most powerful action that you can take to decrease your risk of lung cancer and other pulmonary, breathing related problems. We know quitting is hard, and we are here to help.  Please let us know if there is anything we can do to help you meet your goal of quitting. If you are a former smoker, congratulations. We are proud of you! Remain smoke free! Remember you can refer friends or family members through the number above.  We will screen them to make sure they meet criteria for the program. Thank you for helping us take better care of you by  participating in Lung Screening.  You can receive free nicotine replacement therapy ( patches, gum or mints) by calling 1-800-QUIT NOW. Please call so we can get you on the path to becoming  a non-smoker. I know it is hard, but you can do this!  Lung RADS Categories:  Lung RADS 1: no nodules or definitely non-concerning nodules.  Recommendation is for a repeat annual scan in 12 months.  Lung RADS 2:  nodules that are non-concerning in appearance and behavior with a very low likelihood of becoming an active cancer. Recommendation is for a repeat annual scan in 12 months.  Lung RADS 3: nodules that are probably non-concerning , includes nodules with a low likelihood of becoming an active cancer.  Recommendation is for a 6-month repeat screening scan. Often noted after an upper respiratory illness. We will be in touch to make sure you have no questions, and to schedule your 6-month scan.  Lung RADS 4 A: nodules with concerning findings, recommendation is most often for a follow up scan in 3 months or additional testing based on our provider's assessment of the scan. We will be in touch to make sure you have no questions and to schedule the recommended 3 month follow up scan.  Lung RADS 4 B:  indicates findings that are concerning. We will be in touch with you to schedule additional diagnostic testing based on our provider's  assessment of the scan.  Other options for assistance in smoking cessation (   As covered by your insurance benefits)  Hypnosis for smoking cessation  [employer] [phone]  Acupuncture for smoking cessation  [facility] [phone]   

## 2022-09-16 ENCOUNTER — Ambulatory Visit (HOSPITAL_COMMUNITY)
Admission: RE | Admit: 2022-09-16 | Discharge: 2022-09-16 | Disposition: A | Discharge status: Home / Self Care | Payer: 59 | Source: Ambulatory Visit | Attending: Nurse Practitioner | Admitting: Nurse Practitioner

## 2022-09-16 DIAGNOSIS — Z122 Encounter for screening for malignant neoplasm of respiratory organs: Secondary | ICD-10-CM | POA: Insufficient documentation

## 2022-09-16 DIAGNOSIS — Z87891 Personal history of nicotine dependence: Secondary | ICD-10-CM

## 2022-09-16 DIAGNOSIS — R69 Illness, unspecified: Secondary | ICD-10-CM | POA: Diagnosis not present

## 2022-09-16 DIAGNOSIS — J439 Emphysema, unspecified: Secondary | ICD-10-CM | POA: Insufficient documentation

## 2022-09-16 DIAGNOSIS — F17210 Nicotine dependence, cigarettes, uncomplicated: Secondary | ICD-10-CM | POA: Insufficient documentation

## 2022-09-18 ENCOUNTER — Other Ambulatory Visit: Payer: Self-pay

## 2022-09-18 DIAGNOSIS — Z87891 Personal history of nicotine dependence: Secondary | ICD-10-CM

## 2022-09-18 DIAGNOSIS — F17210 Nicotine dependence, cigarettes, uncomplicated: Secondary | ICD-10-CM

## 2022-09-18 DIAGNOSIS — Z122 Encounter for screening for malignant neoplasm of respiratory organs: Secondary | ICD-10-CM

## 2022-11-23 ENCOUNTER — Encounter: Payer: Self-pay | Admitting: Nurse Practitioner

## 2022-11-23 ENCOUNTER — Telehealth (INDEPENDENT_AMBULATORY_CARE_PROVIDER_SITE_OTHER): Payer: 59 | Admitting: Nurse Practitioner

## 2022-11-23 DIAGNOSIS — K591 Functional diarrhea: Secondary | ICD-10-CM

## 2022-11-23 NOTE — Patient Instructions (Signed)
  Leilah Daniely, thank you for joining Bennie Pierini, FNP for today's virtual visit.  While this provider is not your primary care provider (PCP), if your PCP is located in our provider database this encounter information will be shared with them immediately following your visit.   A [facility] MyChart account gives you access to today's visit and all your visits, tests, and labs performed at [facility] " click here if you don't have a [facility] MyChart account or go to [email or web address]  Consent: (Patient) Deborah Osborne provided verbal consent for this virtual visit at the beginning of the encounter.  Current Medications:  Current Outpatient Medications:    albuterol (PROVENTIL) (2.5 MG/3ML) 0.083% nebulizer solution, Take 3 mLs (2.5 mg total) by nebulization every 6 (six) hours as needed for wheezing or shortness of breath., Disp: 75 mL, Rfl: 1   Albuterol Sulfate (PROAIR RESPICLICK) 108 (90 Base) MCG/ACT AEPB, Inhale 1-2 puffs into the lungs 4 (four) times daily as needed (shob, wheezing). prn, Disp: 1 each, Rfl: 3   fenofibrate (TRICOR) 145 MG tablet, Take 1 tablet (145 mg total) by mouth daily., Disp: 90 tablet, Rfl: 1   levothyroxine (SYNTHROID) 50 MCG tablet, Take 1 tablet (50 mcg total) by mouth daily., Disp: 90 tablet, Rfl: 1   Multiple Vitamins-Minerals (CENTRUM ADULTS PO), Take by mouth., Disp: , Rfl:    omeprazole (PRILOSEC) 20 MG capsule, Take 1 capsule (20 mg total) by mouth daily., Disp: 90 capsule, Rfl: 1   pravastatin (PRAVACHOL) 80 MG tablet, Take 1 tablet (80 mg total) by mouth daily., Disp: 90 tablet, Rfl: 1   rosuvastatin (CRESTOR) 10 MG tablet, Take 1 tablet (10 mg total) by mouth daily., Disp: 90 tablet, Rfl: 3   Medications ordered in this encounter:  No orders of the defined types were placed in this encounter.    *If you need refills on other medications prior to your next appointment, please contact your pharmacy*  Follow-Up: Call  back or seek an in-person evaluation if the symptoms worsen or if the condition fails to improve as anticipated.  [facility] Virtual Care [phone]  Other Instructions Force fluids Imodium OTC Rferral made to GI   If you have been instructed to have an in-person evaluation today at a local Urgent Care facility, please use the link below. It will take you to a list of all of our available [facility], including address, phone number and hours of operation. Please do not delay care.  [facility]  If you or a family member do not have a primary care provider, use the link below to schedule a visit and establish care. When you choose a [facility] primary care physician or advanced practice provider, you gain a long-term partner in health. Find a Primary Care Provider  Learn more about [facility]'s in-office and virtual care options: [facility] - Get Care Now

## 2022-11-23 NOTE — Progress Notes (Signed)
Virtual Visit Consent   Deborah Osborne, you are scheduled for a virtual visit with Mary-Margaret Daphine Deutscher, FNP, a [facility] provider, today.     Just as with appointments in the office, your consent must be obtained to participate.  Your consent will be active for this visit and any virtual visit you may have with one of our providers in the next 365 days.     If you have a MyChart account, a copy of this consent can be sent to you electronically.  All virtual visits are billed to your insurance company just like a traditional visit in the office.    As this is a virtual visit, video technology does not allow for your provider to perform a traditional examination.  This may limit your provider's ability to fully assess your condition.  If your provider identifies any concerns that need to be evaluated in person or the need to arrange testing (such as labs, EKG, etc.), we will make arrangements to do so.     Although advances in technology are sophisticated, we cannot ensure that it will always work on either your end or our end.  If the connection with a video visit is poor, the visit may have to be switched to a telephone visit.  With either a video or telephone visit, we are not always able to ensure that we have a secure connection.     I need to obtain your verbal consent now.   Are you willing to proceed with your visit today? YES   Deborah Osborne has provided verbal consent on 11/23/2022 for a virtual visit (video or telephone).   Mary-Margaret Daphine Deutscher, FNP   Date: 11/23/2022 1:55 PM   Virtual Visit via Video Note   I, Mary-Margaret Brynnlee Cumpian, connected with Deborah Osborne ([id number], 07-Mar-1971) on 11/23/22 at  6:15 PM EDT by a video-enabled telemedicine application and verified that I am speaking with the correct person using two identifiers.  Location: Patient: Virtual Visit Location Patient: Home Provider: Virtual Visit Location Provider: Mobile   I discussed the limitations of  evaluation and management by telemedicine and the availability of in person appointments. The patient expressed understanding and agreed to proceed.    History of Present Illness: Deborah Osborne is a 52 y.o. who identifies as a female who was assigned female at birth, and is being seen today for diarrhea.  HPI: Diarrhea  This is a new problem. The current episode started 1 to 4 weeks ago. The problem occurs 2 to 4 times per day. The problem has been waxing and waning. The stool consistency is described as Blood tinged. The patient states that diarrhea awakens her from sleep. Associated symptoms include abdominal pain (slight). Pertinent negatives include no chills or fever. Nothing aggravates the symptoms. There are no known risk factors. She has tried nothing for the symptoms.    Review of Systems  Constitutional:  Negative for chills and fever.  Gastrointestinal:  Positive for abdominal pain (slight) and diarrhea.    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   BMI 25.0-25.9,adult 03/21/2020   GERD (gastroesophageal reflux disease) 09/26/2018   Asthma 09/26/2018   Constipation 02/04/2018   FH: colon polyps 02/04/2018   S/P cholecystectomy 05/21/2016   Tobacco use 05/21/2016   Mixed hyperlipidemia 05/21/2016    Allergies: No Known Allergies Medications:  Current Outpatient Medications:    albuterol (PROVENTIL) (2.5 MG/3ML)  0.083% nebulizer solution, Take 3 mLs (2.5 mg total) by nebulization every 6 (six) hours as needed for wheezing or shortness of breath., Disp: 75 mL, Rfl: 1   Albuterol Sulfate (PROAIR RESPICLICK) 108 (90 Base) MCG/ACT AEPB, Inhale 1-2 puffs into the lungs 4 (four) times daily as needed (shob, wheezing). prn, Disp: 1 each, Rfl: 3   fenofibrate (TRICOR) 145 MG tablet, Take 1 tablet (145 mg total) by mouth daily., Disp: 90 tablet, Rfl: 1   levothyroxine (SYNTHROID)  50 MCG tablet, Take 1 tablet (50 mcg total) by mouth daily., Disp: 90 tablet, Rfl: 1   Multiple Vitamins-Minerals (CENTRUM ADULTS PO), Take by mouth., Disp: , Rfl:    omeprazole (PRILOSEC) 20 MG capsule, Take 1 capsule (20 mg total) by mouth daily., Disp: 90 capsule, Rfl: 1   pravastatin (PRAVACHOL) 80 MG tablet, Take 1 tablet (80 mg total) by mouth daily., Disp: 90 tablet, Rfl: 1   rosuvastatin (CRESTOR) 10 MG tablet, Take 1 tablet (10 mg total) by mouth daily., Disp: 90 tablet, Rfl: 3  Observations/Objective: Patient is well-developed, well-nourished in no acute distress.  Resting comfortably at home.  Head is normocephalic, atraumatic.  No labored breathing. Speech is clear and coherent with logical content.  Patient is alert and oriented at baseline.    Assessment and Plan:  Deborah Osborne in today with chief complaint of Diarrhea   1. Functional diarrhea Force fluids Iodium AD OTC as needed Come by and pick up GI test kit  Orders Placed This Encounter  Procedures   Cdiff NAA+O+P+Stool Culture    Will go ahead and do gi referral  Follow Up Instructions: I discussed the assessment and treatment plan with the patient. The patient was provided an opportunity to ask questions and all were answered. The patient agreed with the plan and demonstrated an understanding of the instructions.  A copy of instructions were sent to the patient via MyChart.  The patient was advised to call back or seek an in-person evaluation if the symptoms worsen or if the condition fails to improve as anticipated.  Time:  I spent 6 minutes with the patient via telehealth technology discussing the above problems/concerns.    Mary-Margaret Daphine Deutscher, FNP

## 2022-11-24 ENCOUNTER — Other Ambulatory Visit: Payer: 59

## 2022-11-24 DIAGNOSIS — K591 Functional diarrhea: Secondary | ICD-10-CM | POA: Diagnosis not present

## 2022-11-29 LAB — CDIFF NAA+O+P+STOOL CULTURE
E coli, Shiga toxin Assay: NEGATIVE
Toxigenic C. Difficile by PCR: NEGATIVE

## 2022-12-01 ENCOUNTER — Telehealth: Payer: Self-pay

## 2022-12-01 NOTE — Telephone Encounter (Signed)
Deborah Osborne (Deborah Osborne) PA Case ID #: [id number] Rx #: [id number] Need Help? Call us at [phone] Status sent iconSent to Plan today Drug Omeprazole  dr capsules ePA cloud Psychologist, educational Electronic PA Form [phone] NCPDP) Original Claim Info [address] LIMIT EXCD PA REQ`D CALL [phone] MAX QTY OF 90.000 IN 365 DAYSQUANTITY REMAINING .000NEXT FILL DT [id number], LAST FILL [id number]  PHARMAC,PH# [id number] HELP DESK [phone]

## 2022-12-04 NOTE — Telephone Encounter (Signed)
Jariyah Mauri (KeyNolon Lennert) PA Case ID #: [id number] Rx #: [id number] Need Help? Call us at [phone] Outcome Approved on April 23 Your PA request has been approved. Additional information will be provided in the approval communication. (Message 1145) Authorization Expiration Date: 11/30/2023 Drug Omeprazole 20MG  dr capsules ePA cloud Psychologist, educational Electronic PA Form [phone] NCPDP) Original Claim Info [address] LIMIT EXCD PA REQ`D CALL [phone] MAX QTY OF 90.000 IN 365 DAYSQUANTITY REMAINING .000NEXT FILL DT [id number], LAST FILL [id number] @[facility]  PHARMAC,PH# [id number] HELP  Pharmacy informed

## 2022-12-07 NOTE — Progress Notes (Unsigned)
GI Office Note    Referring Provider: Bennie Pierini, * Primary Care Physician:  Bennie Pierini, FNP  Primary Gastroenterologist:  Chief Complaint   No chief complaint on file.    History of Present Illness   Deborah Osborne is a 52 y.o. female presenting today at the request of Mary-Margaret Daphine Deutscher FNP for diarrhea.   Patient last seen in 2019, at that time for FH colon polyps and for constipation.  Diarrhea for several weeks. Stool blood tinged. Wakes her up from sleep.   Stool culture negative for salmonella/shigella, campylobacter, Ecoli shiga toxin, O+P exam. Cdiff pcr negative.    Colonoscopy 03/2018 ([facility]) -non thrombosed external hemorrhoids -one 15mm polyp in recto-sigmoid colon at 20cm removed -internal hemorrhoids -path tubulovillous adenoma -next colonoscopy 3 years.   Medications   Current Outpatient Medications  Medication Sig Dispense Refill   albuterol (PROVENTIL) (2.5 MG/3ML) 0.083% nebulizer solution Take 3 mLs (2.5 mg total) by nebulization every 6 (six) hours as needed for wheezing or shortness of breath. 75 mL 1   Albuterol Sulfate (PROAIR RESPICLICK) 108 (90 Base) MCG/ACT AEPB Inhale 1-2 puffs into the lungs 4 (four) times daily as needed (shob, wheezing). prn 1 each 3   fenofibrate (TRICOR) 145 MG tablet Take 1 tablet (145 mg total) by mouth daily. 90 tablet 1   levothyroxine (SYNTHROID) 50 MCG tablet Take 1 tablet (50 mcg total) by mouth daily. 90 tablet 1   Multiple Vitamins-Minerals (CENTRUM ADULTS PO) Take by mouth.     omeprazole (PRILOSEC) 20 MG capsule Take 1 capsule (20 mg total) by mouth daily. 90 capsule 1   pravastatin (PRAVACHOL) 80 MG tablet Take 1 tablet (80 mg total) by mouth daily. 90 tablet 1   rosuvastatin (CRESTOR) 10 MG tablet Take 1 tablet (10 mg total) by mouth daily. 90 tablet 3   No current facility-administered medications for this visit.    Allergies   Allergies as of 12/08/2022   (No Known  Allergies)    Past Medical History   Past Medical History:  Diagnosis Date   Asthma    Colon polyps 01/16/2016   GERD (gastroesophageal reflux disease)    Hyperlipidemia     Past Surgical History   Past Surgical History:  Procedure Laterality Date   CHOLECYSTECTOMY  2017   TUBAL LIGATION      Past Family History   Family History  Problem Relation Age of Onset   Arthritis Mother    Cancer Mother        ???   Diabetes Mother    Vision loss Mother    Arthritis Father    Early death Father    Heart disease Father    Stroke Father    Colon polyps Sister        Age 35s, large colon polyp requiring resection, advised to have her sibling complete colonoscopy   Arthritis Maternal Grandmother    Colon cancer Neg Hx    Breast cancer Neg Hx     Past Social History   Social History   Socioeconomic History   Marital status: Married    Spouse name: Not on file   Number of children: Not on file   Years of education: Not on file   Highest education level: Not on file  Occupational History   Not on file  Tobacco Use   Smoking status: Every Day    Packs/day: 1.50    Years: 34.00    Additional pack years: 0.00  Total pack years: 51.00    Types: Cigarettes   Smokeless tobacco: Never  Vaping Use   Vaping Use: Never used  Substance and Sexual Activity   Alcohol use: No   Drug use: No   Sexual activity: Not on file  Other Topics Concern   Not on file  Social History Narrative   Not on file   Social Determinants of Health   Financial Resource Strain: Not on file  Food Insecurity: Not on file  Transportation Needs: Not on file  Physical Activity: Not on file  Stress: Not on file  Social Connections: Not on file  Intimate Partner Violence: Not on file    Review of Systems   General: Negative for anorexia, weight loss, fever, chills, fatigue, weakness. Eyes: Negative for vision changes.  ENT: Negative for hoarseness, difficulty swallowing , nasal  congestion. CV: Negative for chest pain, angina, palpitations, dyspnea on exertion, peripheral edema.  Respiratory: Negative for dyspnea at rest, dyspnea on exertion, cough, sputum, wheezing.  GI: See history of present illness. GU:  Negative for dysuria, hematuria, urinary incontinence, urinary frequency, nocturnal urination.  MS: Negative for joint pain, low back pain.  Derm: Negative for rash or itching.  Neuro: Negative for weakness, abnormal sensation, seizure, frequent headaches, memory loss,  confusion.  Psych: Negative for anxiety, depression, suicidal ideation, hallucinations.  Endo: Negative for unusual weight change.  Heme: Negative for bruising or bleeding. Allergy: Negative for rash or hives.  Physical Exam   There were no vitals taken for this visit.   General: Well-nourished, well-developed in no acute distress.  Head: Normocephalic, atraumatic.   Eyes: Conjunctiva pink, no icterus. Mouth: Oropharyngeal mucosa moist and pink , no lesions erythema or exudate. Neck: Supple without thyromegaly, masses, or lymphadenopathy.  Lungs: Clear to auscultation bilaterally.  Heart: Regular rate and rhythm, no murmurs rubs or gallops.  Abdomen: Bowel sounds are normal, nontender, nondistended, no hepatosplenomegaly or masses,  no abdominal bruits or hernia, no rebound or guarding.   Rectal: *** Extremities: No lower extremity edema. No clubbing or deformities.  Neuro: Alert and oriented x 4 , grossly normal neurologically.  Skin: Warm and dry, no rash or jaundice.   Psych: Alert and cooperative, normal mood and affect.  Labs   Lab Results  Component Value Date   CREATININE 1.03 (H) 06/26/2022   BUN 14 06/26/2022   NA 139 06/26/2022   K 4.9 06/26/2022   CL 100 06/26/2022   CO2 23 06/26/2022   Lab Results  Component Value Date   WBC 16.1 (H) 06/26/2022   HGB 13.7 06/26/2022   HCT 41.7 06/26/2022   MCV 90 06/26/2022   PLT 375 06/26/2022   Lab Results  Component  Value Date   ALT 15 06/26/2022   AST 20 06/26/2022   ALKPHOS 57 06/26/2022   BILITOT <0.2 06/26/2022   Lab Results  Component Value Date   TSH 3.440 06/26/2022   No results found for: "HGBA1C"  Imaging Studies   No results found.  Assessment       PLAN   ***   Leanna Battles. Melvyn Neth, MHS, PA-C [facility]

## 2022-12-08 ENCOUNTER — Other Ambulatory Visit: Payer: 59

## 2022-12-08 ENCOUNTER — Ambulatory Visit: Payer: 59 | Admitting: Gastroenterology

## 2022-12-08 ENCOUNTER — Encounter: Payer: Self-pay | Admitting: Gastroenterology

## 2022-12-08 VITALS — BP 138/88 | HR 80 | Temp 97.9°F | Ht 64.0 in | Wt 146.2 lb

## 2022-12-08 DIAGNOSIS — R197 Diarrhea, unspecified: Secondary | ICD-10-CM | POA: Diagnosis not present

## 2022-12-08 MED ORDER — DICYCLOMINE HCL 10 MG PO CAPS: 10.0000 mg | ORAL_CAPSULE | Freq: Three times a day (TID) | ORAL | 3 refills | Status: DC

## 2022-12-08 NOTE — Patient Instructions (Addendum)
Please go to [facility] at [address] [facility] for labs today. Trial of bentyl 10mg  up to four times daily for abdominal cramping loose stools.  We will request copy of records from [facility].

## 2022-12-09 ENCOUNTER — Encounter: Payer: Self-pay | Admitting: Gastroenterology

## 2022-12-09 LAB — COMPREHENSIVE METABOLIC PANEL
ALT: 14 IU/L (ref 0–32)
AST: 21 IU/L (ref 0–40)
Albumin/Globulin Ratio: 1.8 (ref 1.2–2.2)
Albumin: 4.6 g/dL (ref 3.8–4.9)
Alkaline Phosphatase: 64 IU/L (ref 44–121)
BUN/Creatinine Ratio: 11 (ref 9–23)
BUN: 11 mg/dL (ref 6–24)
Bilirubin Total: <0.2 mg/dL (ref 0.0–1.2)
CO2: 22 mmol/L (ref 20–29)
Calcium: 10.2 mg/dL (ref 8.7–10.2)
Chloride: 100 mmol/L (ref 96–106)
Creatinine, Ser: 1.03 mg/dL — ABNORMAL HIGH (ref 0.57–1.00)
Globulin, Total: 2.5 g/dL (ref 1.5–4.5)
Glucose: 116 mg/dL — ABNORMAL HIGH (ref 70–99)
Potassium: 4.8 mmol/L (ref 3.5–5.2)
Sodium: 140 mmol/L (ref 134–144)
Total Protein: 7.1 g/dL (ref 6.0–8.5)
eGFR: 66 mL/min/{1.73_m2} (ref >59)

## 2022-12-09 LAB — CBC WITH DIFFERENTIAL/PLATELET
Basophils Absolute: 0 10*3/uL (ref 0.0–0.2)
Basos: 0 %
EOS (ABSOLUTE): 0.1 10*3/uL (ref 0.0–0.4)
Eos: 1 %
Hematocrit: 42.2 % (ref 34.0–46.6)
Hemoglobin: 13.8 g/dL (ref 11.1–15.9)
Immature Grans (Abs): 0 10*3/uL (ref 0.0–0.1)
Immature Granulocytes: 0 %
Lymphocytes Absolute: 3.8 10*3/uL — ABNORMAL HIGH (ref 0.7–3.1)
Lymphs: 35 %
MCH: 29.2 pg (ref 26.6–33.0)
MCHC: 32.7 g/dL (ref 31.5–35.7)
MCV: 89 fL (ref 79–97)
Monocytes Absolute: 0.8 10*3/uL (ref 0.1–0.9)
Monocytes: 7 %
Neutrophils Absolute: 6 10*3/uL (ref 1.4–7.0)
Neutrophils: 57 %
Platelets: 420 10*3/uL (ref 150–450)
RBC: 4.72 x10E6/uL (ref 3.77–5.28)
RDW: 13.2 % (ref 11.7–15.4)
WBC: 10.7 10*3/uL (ref 3.4–10.8)

## 2022-12-09 LAB — SEDIMENTATION RATE: Sed Rate: 12 mm/hr (ref 0–40)

## 2022-12-09 LAB — C-REACTIVE PROTEIN: CRP: 10 mg/L (ref 0–10)

## 2022-12-09 LAB — IGA: IgA/Immunoglobulin A, Serum: 152 mg/dL (ref 87–352)

## 2022-12-09 LAB — TSH: TSH: 4.42 u[IU]/mL (ref 0.450–4.500)

## 2022-12-09 LAB — TISSUE TRANSGLUTAMINASE, IGA: Transglutaminase IgA: <2 U/mL (ref 0–3)

## 2022-12-11 NOTE — Patient Instructions (Addendum)
Managing the Challenge of Quitting Smoking Quitting smoking is a physical and mental challenge. You may have cravings, withdrawal symptoms, and temptation to smoke. Before quitting, work with your health care provider to make a plan that can help you manage quitting. Making a plan before you quit may keep you from smoking when you have the urge to smoke while trying to quit. How to manage lifestyle changes Managing stress Stress can make you want to smoke, and wanting to smoke may cause stress. It is important to find ways to manage your stress. You could try some of the following: Practice relaxation techniques. Breathe slowly and deeply, in through your nose and out through your mouth. Listen to music. Soak in a bath or take a shower. Imagine a peaceful place or vacation. Get some support. Talk with family or friends about your stress. Join a support group. Talk with a counselor or therapist. Get some physical activity. Go for a walk, run, or bike ride. Play a favorite sport. Practice yoga.  Medicines Talk with your health care provider about medicines that might help you deal with cravings and make quitting easier for you. Relationships Social situations can be difficult when you are quitting smoking. To manage this, you can: Avoid parties and other social situations where people might be smoking. Avoid alcohol. Leave right away if you have the urge to smoke. Explain to your family and friends that you are quitting smoking. Ask for support and let them know you might be a bit grumpy. Plan activities where smoking is not an option. General instructions Be aware that many people gain weight after they quit smoking. However, not everyone does. To keep from gaining weight, have a plan in place before you quit, and stick to the plan after you quit. Your plan should include: Eating healthy snacks. When you have a craving, it may help to: Eat popcorn, or try carrots, celery, or other cut  vegetables. Chew sugar-free gum. Changing how you eat. Eat small portion sizes at meals. Eat 4-6 small meals throughout the day instead of 1-2 large meals a day. Be mindful when you eat. You should avoid watching television or doing other things that might distract you as you eat. Exercising regularly. Make time to exercise each day. If you do not have time for a long workout, do short bouts of exercise for 5-10 minutes several times a day. Do some form of strengthening exercise, such as weight lifting. Do some exercise that gets your heart beating and causes you to breathe deeply, such as walking fast, running, swimming, or biking. This is very important. Drinking plenty of water or other low-calorie or no-calorie drinks. Drink enough fluid to keep your urine pale yellow.  How to recognize withdrawal symptoms Your body and mind may experience discomfort as you try to get used to not having nicotine in your system. These effects are called withdrawal symptoms. They may include: Feeling hungrier than normal. Having trouble concentrating. Feeling irritable or restless. Having trouble sleeping. Feeling depressed. Craving a cigarette. These symptoms may surprise you, but they are normal to have when quitting smoking. To manage withdrawal symptoms: Avoid places, people, and activities that trigger your cravings. Remember why you want to quit. Get plenty of sleep. Avoid coffee and other drinks that contain caffeine. These may worsen some of your symptoms. How to manage cravings Come up with a plan for how to deal with your cravings. The plan should include the following: A definition of the specific situation  you want to deal with. An activity or action you will take to replace smoking. A clear idea for how this action will help. The name of someone who could help you with this. Cravings usually last for 5-10 minutes. Consider taking the following actions to help you with your plan to deal  with cravings: Keep your mouth busy. Chew sugar-free gum. Suck on hard candies or a straw. Brush your teeth. Keep your hands and body busy. Change to a different activity right away. Squeeze or play with a ball. Do an activity or a hobby, such as making bead jewelry, practicing needlepoint, or working with wood. Mix up your normal routine. Take a short exercise break. Go for a quick walk, or run up and down stairs. Focus on doing something kind or helpful for someone else. Call a friend or family member to talk during a craving. Join a support group. Contact a quitline. Where to find support To get help or find a support group: Call the National Cancer Institute's Smoking Quitline: 1-800-QUIT-NOW 662-206-7289) Text QUIT to SmokefreeTXT: 454098 Where to find more information Visit these websites to find more information on quitting smoking: U.S. Department of Health and Human Services: www.smokefree.gov American Lung Association: www.freedomfromsmoking.org Centers for Disease Control and Prevention (CDC): FootballExhibition.com.br American Heart Association: www.heart.org Contact a health care provider if: You want to change your plan for quitting. The medicines you are taking are not helping. Your eating feels out of control or you cannot sleep. You feel depressed or become very anxious. Summary Quitting smoking is a physical and mental challenge. You will face cravings, withdrawal symptoms, and temptation to smoke again. Preparation can help you as you go through these challenges. Try different techniques to manage stress, handle social situations, and prevent weight gain. You can deal with cravings by keeping your mouth busy (such as by chewing gum), keeping your hands and body busy, calling family or friends, or contacting a quitline for people who want to quit smoking. You can deal with withdrawal symptoms by avoiding places where people smoke, getting plenty of rest, and avoiding drinks that  contain caffeine. This information is not intended to replace advice given to you by your health care provider. Make sure you discuss any questions you have with your health care provider. Document Revised: 07/18/2021 Document Reviewed: 07/18/2021 Elsevier Patient Education  2023 [employer]. Our records indicate that you are due for your screening mammogram.  Please call the imaging center that does your yearly mammograms to make an appointment for a mammogram at your earliest convenience. Our office also has a mobile unit through the [facility] that comes to our location. Please call our office if you would like to make an appointment.

## 2022-12-25 ENCOUNTER — Encounter: Payer: Self-pay | Admitting: Nurse Practitioner

## 2022-12-25 ENCOUNTER — Ambulatory Visit: Payer: 59 | Admitting: Nurse Practitioner

## 2022-12-25 VITALS — BP 120/73 | HR 73 | Temp 97.7°F | Resp 20 | Ht 64.0 in | Wt 147.0 lb

## 2022-12-25 DIAGNOSIS — J4520 Mild intermittent asthma, uncomplicated: Secondary | ICD-10-CM | POA: Diagnosis not present

## 2022-12-25 DIAGNOSIS — Z720 Tobacco use: Secondary | ICD-10-CM

## 2022-12-25 DIAGNOSIS — E782 Mixed hyperlipidemia: Secondary | ICD-10-CM

## 2022-12-25 DIAGNOSIS — E039 Hypothyroidism, unspecified: Secondary | ICD-10-CM | POA: Diagnosis not present

## 2022-12-25 DIAGNOSIS — Z6825 Body mass index (BMI) 25.0-25.9, adult: Secondary | ICD-10-CM | POA: Diagnosis not present

## 2022-12-25 DIAGNOSIS — K219 Gastro-esophageal reflux disease without esophagitis: Secondary | ICD-10-CM | POA: Diagnosis not present

## 2022-12-25 LAB — LIPID PANEL

## 2022-12-25 LAB — CBC WITH DIFFERENTIAL/PLATELET
EOS (ABSOLUTE): 0.1 10*3/uL (ref 0.0–0.4)
Eos: 1 %
Hemoglobin: 12.7 g/dL (ref 11.1–15.9)
Immature Granulocytes: 0 %
Lymphocytes Absolute: 3.5 10*3/uL — ABNORMAL HIGH (ref 0.7–3.1)
MCH: 29.4 pg (ref 26.6–33.0)
MCHC: 33 g/dL (ref 31.5–35.7)
Monocytes Absolute: 0.6 10*3/uL (ref 0.1–0.9)

## 2022-12-25 LAB — CMP14+EGFR

## 2022-12-25 MED ORDER — ROSUVASTATIN CALCIUM 10 MG PO TABS: 10.0000 mg | ORAL_TABLET | Freq: Every day | ORAL | 1 refills | Status: DC

## 2022-12-25 MED ORDER — OMEPRAZOLE 20 MG PO CPDR: 20.0000 mg | DELAYED_RELEASE_CAPSULE | Freq: Every day | ORAL | 1 refills | Status: DC

## 2022-12-25 MED ORDER — FENOFIBRATE 145 MG PO TABS: 145.0000 mg | ORAL_TABLET | Freq: Every day | ORAL | 1 refills | Status: DC

## 2022-12-25 MED ORDER — LEVOTHYROXINE SODIUM 50 MCG PO TABS
50.0000 ug | ORAL_TABLET | Freq: Every day | ORAL | 1 refills | Status: DC
Start: 2022-12-25 — End: 2023-07-01

## 2022-12-25 NOTE — Addendum Note (Signed)
Addended by: Bennie Pierini on: 12/25/2022 04:04 PM   Modules accepted: Orders

## 2022-12-25 NOTE — Progress Notes (Signed)
Subjective:    Patient ID: Deborah Osborne, female    DOB: 06-30-1971, 52 y.o.   MRN: [id number]   Chief Complaint: medical management of chronic issues     HPI:  Deborah Osborne is a 52 y.o. who identifies as a female who was assigned female at birth.   Social history: Lives with: husband Work history: [employer]   Comes in today for follow up of the following chronic medical issues:  1. Mixed hyperlipidemia Tries to watch diet, but does not do much exercise.recently started on crestor Lab Results  Component Value Date   CHOL 185 06/26/2022   HDL 30 (L) 06/26/2022   LDLCALC 110 (H) 06/26/2022   TRIG 256 (H) 06/26/2022   CHOLHDL 6.2 (H) 06/26/2022   The 10-year ASCVD risk score (Arnett DK, et al., 2019) is: 7.9%   2. Mild intermittent asthma without complication Only uses proair as needed  3. Gastroesophageal reflux disease without esophagitis Is on omeprazole daily and that seems to work well for her  4. Acquired hypothyroidism No issues that she is aware of Lab Results  Component Value Date   TSH 4.420 12/08/2022     5. Tobacco use Smokes 1 1/2 packs a day  6. BMI 25.0-25.9,adult No recent weight changes Wt Readings from Last 3 Encounters:  12/25/22 147 lb (66.7 kg)  12/08/22 146 lb 3.2 oz (66.3 kg)  06/26/22 152 lb (68.9 kg)   BMI Readings from Last 3 Encounters:  12/25/22 25.23 kg/m  12/08/22 25.10 kg/m  06/26/22 26.09 kg/m     New complaints: None today  No Known Allergies Outpatient Encounter Medications as of 12/25/2022  Medication Sig   albuterol (PROVENTIL) (2.5 MG/3ML) 0.083% nebulizer solution Take 3 mLs (2.5 mg total) by nebulization every 6 (six) hours as needed for wheezing or shortness of breath.   Albuterol Sulfate (PROAIR RESPICLICK) 108 (90 Base) MCG/ACT AEPB Inhale 1-2 puffs into the lungs 4 (four) times daily as needed (shob, wheezing). prn   dicyclomine (BENTYL) 10 MG capsule Take 1 capsule (10 mg total) by mouth 4 (four)  times daily -  before meals and at bedtime.   fenofibrate (TRICOR) 145 MG tablet Take 1 tablet (145 mg total) by mouth daily.   levothyroxine (SYNTHROID) 50 MCG tablet Take 1 tablet (50 mcg total) by mouth daily.   Multiple Vitamins-Minerals (CENTRUM ADULTS PO) Take by mouth.   omeprazole (PRILOSEC) 20 MG capsule Take 1 capsule (20 mg total) by mouth daily.   OVER THE COUNTER MEDICATION Omega Red   pravastatin (PRAVACHOL) 80 MG tablet Take 1 tablet (80 mg total) by mouth daily.   rosuvastatin (CRESTOR) 10 MG tablet Take 1 tablet (10 mg total) by mouth daily.   No facility-administered encounter medications on file as of 12/25/2022.    Past Surgical History:  Procedure Laterality Date   CHOLECYSTECTOMY  2017   TUBAL LIGATION      Family History  Problem Relation Age of Onset   Arthritis Mother    Cancer Mother        ???   Diabetes Mother    Vision loss Mother    Arthritis Father    Early death Father    Heart disease Father    Stroke Father    Colon polyps Sister        Age 67s, large colon polyp requiring resection, advised to have her sibling complete colonoscopy   Arthritis Maternal Grandmother    Colon cancer Neg Hx  Breast cancer Neg Hx       Controlled substance contract: n/a     Review of Systems  Constitutional:  Negative for diaphoresis.  Eyes:  Negative for pain.  Respiratory:  Negative for shortness of breath.   Cardiovascular:  Negative for chest pain, palpitations and leg swelling.  Gastrointestinal:  Negative for abdominal pain.  Endocrine: Negative for polydipsia.  Skin:  Negative for rash.  Neurological:  Negative for dizziness, weakness and headaches.  Hematological:  Does not bruise/bleed easily.  All other systems reviewed and are negative.      Objective:   Physical Exam Vitals and nursing note reviewed.  Constitutional:      General: She is not in acute distress.    Appearance: Normal appearance. She is well-developed.  HENT:      Head: Normocephalic.     Right Ear: Tympanic membrane normal.     Left Ear: Tympanic membrane normal.     Nose: Nose normal.     Mouth/Throat:     Mouth: Mucous membranes are moist.  Eyes:     Pupils: Pupils are equal, round, and reactive to light.  Neck:     Vascular: No carotid bruit or JVD.  Cardiovascular:     Rate and Rhythm: Normal rate and regular rhythm.     Heart sounds: Normal heart sounds.  Pulmonary:     Effort: Pulmonary effort is normal. No respiratory distress.     Breath sounds: Normal breath sounds. No wheezing or rales.  Chest:     Chest wall: No tenderness.  Abdominal:     General: Bowel sounds are normal. There is no distension or abdominal bruit.     Palpations: Abdomen is soft. There is no hepatomegaly, splenomegaly, mass or pulsatile mass.     Tenderness: There is no abdominal tenderness.  Musculoskeletal:        General: Normal range of motion.     Cervical back: Normal range of motion and neck supple.  Lymphadenopathy:     Cervical: No cervical adenopathy.  Skin:    General: Skin is warm and dry.  Neurological:     Mental Status: She is alert and oriented to person, place, and time.     Deep Tendon Reflexes: Reflexes are normal and symmetric.  Psychiatric:        Behavior: Behavior normal.        Thought Content: Thought content normal.        Judgment: Judgment normal.    BP 120/73   Pulse 73   Temp 97.7 F (36.5 C) (Temporal)   Resp 20   Ht 5\' 4"  (1.626 m)   Wt 147 lb (66.7 kg)   SpO2 96%   BMI 25.23 kg/m         Assessment & Plan:   Deborah Osborne comes in today with chief complaint of Medical Management of Chronic Issues   Diagnosis and orders addressed:  1. Mixed hyperlipidemia Low fat diet - fenofibrate (TRICOR) 145 MG tablet; Take 1 tablet (145 mg total) by mouth daily.  Dispense: 90 tablet; Refill: 1 - rosuvastatin (CRESTOR) 10 MG tablet; Take 1 tablet (10 mg total) by mouth daily.  Dispense: 90 tablet; Refill: 1  2.  Mild intermittent asthma without complication  3. Gastroesophageal reflux disease without esophagitis Avoid spicy foods Do not eat 2 hours prior to bedtime - omeprazole (PRILOSEC) 20 MG capsule; Take 1 capsule (20 mg total) by mouth daily.  Dispense: 90 capsule; Refill: 1  4.  Acquired hypothyroidism Labs pending - levothyroxine (SYNTHROID) 50 MCG tablet; Take 1 tablet (50 mcg total) by mouth daily.  Dispense: 90 tablet; Refill: 1  5. Tobacco use Smoking cessation encouraged  6. BMI 25.0-25.9,adult Discussed diet and exercise for person with BMI >25 Will recheck weight in 3-6 months    Labs pending Health Maintenance reviewed Diet and exercise encouraged  Follow up plan: 6 months   Mary-Margaret Daphine Deutscher, FNP

## 2022-12-26 LAB — CBC WITH DIFFERENTIAL/PLATELET
Basophils Absolute: 0 10*3/uL (ref 0.0–0.2)
Basos: 0 %
Hematocrit: 38.5 % (ref 34.0–46.6)
Immature Grans (Abs): 0 10*3/uL (ref 0.0–0.1)
Lymphs: 37 %
MCV: 89 fL (ref 79–97)
Monocytes: 7 %
Neutrophils Absolute: 5.1 10*3/uL (ref 1.4–7.0)
Neutrophils: 55 %
Platelets: 365 10*3/uL (ref 150–450)
RBC: 4.32 x10E6/uL (ref 3.77–5.28)
RDW: 13 % (ref 11.7–15.4)
WBC: 9.4 10*3/uL (ref 3.4–10.8)

## 2022-12-26 LAB — CMP14+EGFR
ALT: 14 IU/L (ref 0–32)
AST: 19 IU/L (ref 0–40)
BUN/Creatinine Ratio: 10 (ref 9–23)
BUN: 10 mg/dL (ref 6–24)
Bilirubin Total: <0.2 mg/dL (ref 0.0–1.2)
Calcium: 9.8 mg/dL (ref 8.7–10.2)
Chloride: 104 mmol/L (ref 96–106)
Creatinine, Ser: 0.98 mg/dL (ref 0.57–1.00)
eGFR: 70 mL/min/{1.73_m2} (ref >59)

## 2022-12-26 LAB — LIPID PANEL
Chol/HDL Ratio: 5.2 ratio — ABNORMAL HIGH (ref 0.0–4.4)
HDL: 32 mg/dL — ABNORMAL LOW (ref >39)
LDL Chol Calc (NIH): 92 mg/dL (ref 0–99)
Triglycerides: 239 mg/dL — ABNORMAL HIGH (ref 0–149)

## 2022-12-26 LAB — THYROID PANEL WITH TSH
Free Thyroxine Index: 2.5 (ref 1.2–4.9)
T3 Uptake Ratio: 28 % (ref 24–39)
T4, Total: 9 ug/dL (ref 4.5–12.0)
TSH: 1.55 u[IU]/mL (ref 0.450–4.500)

## 2023-01-05 ENCOUNTER — Other Ambulatory Visit: Payer: Self-pay | Admitting: Nurse Practitioner

## 2023-01-05 DIAGNOSIS — Z1231 Encounter for screening mammogram for malignant neoplasm of breast: Secondary | ICD-10-CM

## 2023-01-06 ENCOUNTER — Ambulatory Visit
Admission: RE | Admit: 2023-01-06 | Discharge: 2023-01-06 | Disposition: A | Discharge status: Home / Self Care | Payer: 59 | Source: Ambulatory Visit | Attending: Nurse Practitioner | Admitting: Nurse Practitioner

## 2023-01-06 DIAGNOSIS — Z1231 Encounter for screening mammogram for malignant neoplasm of breast: Secondary | ICD-10-CM

## 2023-01-07 ENCOUNTER — Inpatient Hospital Stay: Admission: RE | Admit: 2023-01-07 | Payer: 59 | Source: Ambulatory Visit

## 2023-01-21 ENCOUNTER — Other Ambulatory Visit: Payer: Self-pay | Admitting: Nurse Practitioner

## 2023-01-21 DIAGNOSIS — E782 Mixed hyperlipidemia: Secondary | ICD-10-CM

## 2023-02-14 ENCOUNTER — Other Ambulatory Visit: Payer: Self-pay | Admitting: Nurse Practitioner

## 2023-02-14 DIAGNOSIS — E782 Mixed hyperlipidemia: Secondary | ICD-10-CM

## 2023-02-21 ENCOUNTER — Other Ambulatory Visit: Payer: Self-pay | Admitting: Nurse Practitioner

## 2023-02-21 DIAGNOSIS — E782 Mixed hyperlipidemia: Secondary | ICD-10-CM

## 2023-03-02 ENCOUNTER — Other Ambulatory Visit: Payer: Self-pay | Admitting: Nurse Practitioner

## 2023-03-02 DIAGNOSIS — E782 Mixed hyperlipidemia: Secondary | ICD-10-CM

## 2023-05-04 DIAGNOSIS — Z86010 Personal history of colonic polyps: Secondary | ICD-10-CM | POA: Diagnosis not present

## 2023-05-04 DIAGNOSIS — D369 Benign neoplasm, unspecified site: Secondary | ICD-10-CM | POA: Diagnosis not present

## 2023-05-04 DIAGNOSIS — Z9049 Acquired absence of other specified parts of digestive tract: Secondary | ICD-10-CM | POA: Diagnosis not present

## 2023-05-04 DIAGNOSIS — R195 Other fecal abnormalities: Secondary | ICD-10-CM | POA: Diagnosis not present

## 2023-07-01 ENCOUNTER — Encounter: Payer: Self-pay | Admitting: Nurse Practitioner

## 2023-07-01 ENCOUNTER — Ambulatory Visit: Payer: 59 | Admitting: Nurse Practitioner

## 2023-07-01 VITALS — BP 126/81 | HR 69 | Temp 97.5°F | Resp 20 | Ht 64.0 in | Wt 140.0 lb

## 2023-07-01 DIAGNOSIS — E782 Mixed hyperlipidemia: Secondary | ICD-10-CM | POA: Diagnosis not present

## 2023-07-01 DIAGNOSIS — E039 Hypothyroidism, unspecified: Secondary | ICD-10-CM | POA: Diagnosis not present

## 2023-07-01 DIAGNOSIS — Z23 Encounter for immunization: Secondary | ICD-10-CM

## 2023-07-01 DIAGNOSIS — Z6825 Body mass index (BMI) 25.0-25.9, adult: Secondary | ICD-10-CM

## 2023-07-01 DIAGNOSIS — K219 Gastro-esophageal reflux disease without esophagitis: Secondary | ICD-10-CM | POA: Diagnosis not present

## 2023-07-01 DIAGNOSIS — Z720 Tobacco use: Secondary | ICD-10-CM

## 2023-07-01 MED ORDER — FENOFIBRATE 145 MG PO TABS
145.0000 mg | ORAL_TABLET | Freq: Every day | ORAL | 1 refills | Status: DC
Start: 2023-07-01 — End: 2023-12-24

## 2023-07-01 MED ORDER — OMEPRAZOLE 20 MG PO CPDR: 20.0000 mg | DELAYED_RELEASE_CAPSULE | Freq: Every day | ORAL | 1 refills | Status: DC

## 2023-07-01 MED ORDER — LEVOTHYROXINE SODIUM 50 MCG PO TABS: 50.0000 ug | ORAL_TABLET | Freq: Every day | ORAL | 1 refills | Status: DC

## 2023-07-01 MED ORDER — ROSUVASTATIN CALCIUM 10 MG PO TABS: 10.0000 mg | ORAL_TABLET | Freq: Every day | ORAL | 1 refills | Status: DC

## 2023-07-01 NOTE — Patient Instructions (Signed)

## 2023-07-01 NOTE — Progress Notes (Signed)
Subjective:    Patient ID: Deborah Osborne, female    DOB: [birth year], 52 y.o.   MRN: [id number]   Chief Complaint: medical management of chronic issues     HPI:  Deborah Osborne is a 52 y.o. who identifies as a female who was assigned female at birth.   Social history: Lives with: husband Work history: [employer]   Comes in today for follow up of the following chronic medical issues:  1. Mixed hyperlipidemia Does not watch diet very closely. Does no exercise Lab Results  Component Value Date   CHOL 165 12/25/2022   HDL 32 (L) 12/25/2022   LDLCALC 92 12/25/2022   TRIG 239 (H) 12/25/2022   CHOLHDL 5.2 (H) 12/25/2022   The 10-year ASCVD risk score (Arnett DK, et al., 2019) is: 5.4%   2. Acquired hypothyroidism No issues that she is aware of Lab Results  Component Value Date   TSH 1.550 12/25/2022     3. Gastroesophageal reflux disease without esophagitis Is on omeprazole and is doing well  4. Tobacco use Smokes over  a pack a day. Low dose ct scan was done 09/16/22. Negative- to continue yearly checks.  5. BMI 25.0-25.9,adult Weight is down 7 lbs Wt Readings from Last 3 Encounters:  07/01/23 140 lb (63.5 kg)  12/25/22 147 lb (66.7 kg)  12/08/22 146 lb 3.2 oz (66.3 kg)   BMI Readings from Last 3 Encounters:  07/01/23 24.03 kg/m  12/25/22 25.23 kg/m  12/08/22 25.10 kg/m     New complaints: None today  No Known Allergies Outpatient Encounter Medications as of 07/01/2023  Medication Sig   albuterol (PROVENTIL) (2.5 MG/3ML) 0.083% nebulizer solution Take 3 mLs (2.5 mg total) by nebulization every 6 (six) hours as needed for wheezing or shortness of breath.   Albuterol Sulfate (PROAIR RESPICLICK) 108 (90 Base) MCG/ACT AEPB Inhale 1-2 puffs into the lungs 4 (four) times daily as needed (shob, wheezing). prn   dicyclomine (BENTYL) 10 MG capsule Take 1 capsule (10 mg total) by mouth 4 (four) times daily -  before meals and at bedtime.   fenofibrate (TRICOR)  145 MG tablet Take 1 tablet (145 mg total) by mouth daily.   levothyroxine (SYNTHROID) 50 MCG tablet Take 1 tablet (50 mcg total) by mouth daily.   Multiple Vitamins-Minerals (CENTRUM ADULTS PO) Take by mouth.   omeprazole (PRILOSEC) 20 MG capsule Take 1 capsule (20 mg total) by mouth daily.   OVER THE COUNTER MEDICATION Omega Red   rosuvastatin (CRESTOR) 10 MG tablet Take 1 tablet (10 mg total) by mouth daily.   No facility-administered encounter medications on file as of 07/01/2023.    Past Surgical History:  Procedure Laterality Date   CHOLECYSTECTOMY  2017   TUBAL LIGATION      Family History  Problem Relation Age of Onset   Arthritis Mother    Cancer Mother        ???   Diabetes Mother    Vision loss Mother    Arthritis Father    Early death Father    Heart disease Father    Stroke Father    Colon polyps Sister        Age 85s, large colon polyp requiring resection, advised to have her sibling complete colonoscopy   Arthritis Maternal Grandmother    Colon cancer Neg Hx    Breast cancer Neg Hx       Controlled substance contract: n/a     Review of Systems  Constitutional:  Negative for diaphoresis.  Eyes:  Negative for pain.  Respiratory:  Negative for shortness of breath.   Cardiovascular:  Negative for chest pain, palpitations and leg swelling.  Gastrointestinal:  Negative for abdominal pain.  Endocrine: Negative for polydipsia.  Skin:  Negative for rash.  Neurological:  Negative for dizziness, weakness and headaches.  Hematological:  Does not bruise/bleed easily.  All other systems reviewed and are negative.      Objective:   Physical Exam Vitals and nursing note reviewed.  Constitutional:      General: She is not in acute distress.    Appearance: Normal appearance. She is well-developed.  HENT:     Head: Normocephalic.     Right Ear: Tympanic membrane normal.     Left Ear: Tympanic membrane normal.     Nose: Nose normal.     Mouth/Throat:      Mouth: Mucous membranes are moist.  Eyes:     Pupils: Pupils are equal, round, and reactive to light.  Neck:     Vascular: No carotid bruit or JVD.  Cardiovascular:     Rate and Rhythm: Normal rate and regular rhythm.     Heart sounds: Normal heart sounds.  Pulmonary:     Effort: Pulmonary effort is normal. No respiratory distress.     Breath sounds: Normal breath sounds. No wheezing or rales.  Chest:     Chest wall: No tenderness.  Abdominal:     General: Bowel sounds are normal. There is no distension or abdominal bruit.     Palpations: Abdomen is soft. There is no hepatomegaly, splenomegaly, mass or pulsatile mass.     Tenderness: There is no abdominal tenderness.  Musculoskeletal:        General: Normal range of motion.     Cervical back: Normal range of motion and neck supple.  Lymphadenopathy:     Cervical: No cervical adenopathy.  Skin:    General: Skin is warm and dry.  Neurological:     Mental Status: She is alert and oriented to person, place, and time.     Deep Tendon Reflexes: Reflexes are normal and symmetric.  Psychiatric:        Behavior: Behavior normal.        Thought Content: Thought content normal.        Judgment: Judgment normal.     BP 126/81   Pulse 69   Temp (!) 97.5 F (36.4 C) (Temporal)   Resp 20   Ht 5\' 4"  (1.626 m)   Wt 140 lb (63.5 kg)   SpO2 96%   BMI 24.03 kg/m        Assessment & Plan:   Deborah Osborne comes in today with chief complaint of Medical Management of Chronic Issues   Diagnosis and orders addressed:  1. Mixed hyperlipidemia Low fat diet - fenofibrate (TRICOR) 145 MG tablet; Take 1 tablet (145 mg total) by mouth daily.  Dispense: 90 tablet; Refill: 1 - rosuvastatin (CRESTOR) 10 MG tablet; Take 1 tablet (10 mg total) by mouth daily.  Dispense: 90 tablet; Refill: 1 - CBC with Differential/Platelet - CMP14+EGFR - Lipid panel  2. Acquired hypothyroidism Labs pending - levothyroxine (SYNTHROID) 50 MCG tablet; Take  1 tablet (50 mcg total) by mouth daily.  Dispense: 90 tablet; Refill: 1 - Thyroid Panel With TSH  3. Gastroesophageal reflux disease without esophagitis Avoid spicy foods Do not eat 2 hours prior to bedtime - omeprazole (PRILOSEC) 20 MG capsule; Take 1 capsule (20 mg total)  by mouth daily.  Dispense: 90 capsule; Refill: 1  4. Tobacco use Smoking cessation encouraged  5. BMI 25.0-25.9,adult Discussed diet and exercise for person with BMI >25 Will recheck weight in 3-6 months    Labs pending Health Maintenance reviewed Diet and exercise encouraged  Follow up plan: 6 months   Mary-Margaret Daphine Deutscher, FNP

## 2023-07-02 LAB — LIPID PANEL
Chol/HDL Ratio: 5.3 ratio — ABNORMAL HIGH (ref 0.0–4.4)
Cholesterol, Total: 154 mg/dL (ref 100–199)
HDL: 29 mg/dL — ABNORMAL LOW (ref >39)
LDL Chol Calc (NIH): 84 mg/dL (ref 0–99)
Triglycerides: 245 mg/dL — ABNORMAL HIGH (ref 0–149)
VLDL Cholesterol Cal: 41 mg/dL — ABNORMAL HIGH (ref 5–40)

## 2023-07-02 LAB — CBC WITH DIFFERENTIAL/PLATELET
Basophils Absolute: 0.1 10*3/uL (ref 0.0–0.2)
Basos: 0 %
EOS (ABSOLUTE): 0.1 10*3/uL (ref 0.0–0.4)
Eos: 1 %
Hematocrit: 40.3 % (ref 34.0–46.6)
Hemoglobin: 13 g/dL (ref 11.1–15.9)
Immature Grans (Abs): 0 10*3/uL (ref 0.0–0.1)
Immature Granulocytes: 0 %
Lymphocytes Absolute: 3.9 10*3/uL — ABNORMAL HIGH (ref 0.7–3.1)
Lymphs: 23 %
MCH: 30.1 pg (ref 26.6–33.0)
MCHC: 32.3 g/dL (ref 31.5–35.7)
MCV: 93 fL (ref 79–97)
Monocytes Absolute: 1.1 10*3/uL — ABNORMAL HIGH (ref 0.1–0.9)
Monocytes: 6 %
Neutrophils Absolute: 11.7 10*3/uL — ABNORMAL HIGH (ref 1.4–7.0)
Neutrophils: 70 %
Platelets: 353 10*3/uL (ref 150–450)
RBC: 4.32 x10E6/uL (ref 3.77–5.28)
RDW: 13 % (ref 11.7–15.4)
WBC: 17 10*3/uL — ABNORMAL HIGH (ref 3.4–10.8)

## 2023-07-02 LAB — THYROID PANEL WITH TSH
Free Thyroxine Index: 2.8 (ref 1.2–4.9)
T3 Uptake Ratio: 27 % (ref 24–39)
T4, Total: 10.2 ug/dL (ref 4.5–12.0)
TSH: 1.92 u[IU]/mL (ref 0.450–4.500)

## 2023-07-02 LAB — CMP14+EGFR
ALT: 11 [IU]/L (ref 0–32)
AST: 17 [IU]/L (ref 0–40)
Albumin: 4.2 g/dL (ref 3.8–4.9)
Alkaline Phosphatase: 59 [IU]/L (ref 44–121)
BUN/Creatinine Ratio: 11 (ref 9–23)
BUN: 13 mg/dL (ref 6–24)
Bilirubin Total: <0.2 mg/dL (ref 0.0–1.2)
CO2: 23 mmol/L (ref 20–29)
Calcium: 9.8 mg/dL (ref 8.7–10.2)
Chloride: 102 mmol/L (ref 96–106)
Creatinine, Ser: 1.2 mg/dL — ABNORMAL HIGH (ref 0.57–1.00)
Globulin, Total: 2.4 g/dL (ref 1.5–4.5)
Glucose: 87 mg/dL (ref 70–99)
Potassium: 4.7 mmol/L (ref 3.5–5.2)
Sodium: 140 mmol/L (ref 134–144)
Total Protein: 6.6 g/dL (ref 6.0–8.5)
eGFR: 55 mL/min/{1.73_m2} — ABNORMAL LOW (ref >59)

## 2023-07-29 ENCOUNTER — Other Ambulatory Visit: Payer: Self-pay | Admitting: Acute Care

## 2023-07-29 DIAGNOSIS — Z122 Encounter for screening for malignant neoplasm of respiratory organs: Secondary | ICD-10-CM

## 2023-07-29 DIAGNOSIS — Z87891 Personal history of nicotine dependence: Secondary | ICD-10-CM

## 2023-07-29 DIAGNOSIS — F17210 Nicotine dependence, cigarettes, uncomplicated: Secondary | ICD-10-CM

## 2023-09-20 ENCOUNTER — Inpatient Hospital Stay
Admission: RE | Admit: 2023-09-20 | Discharge: 2023-09-20 | Disposition: A | Discharge status: Home / Self Care | Payer: 59 | Source: Ambulatory Visit | Attending: Nurse Practitioner | Admitting: Nurse Practitioner

## 2023-09-20 ENCOUNTER — Ambulatory Visit (HOSPITAL_COMMUNITY): Payer: 59

## 2023-09-20 DIAGNOSIS — F17210 Nicotine dependence, cigarettes, uncomplicated: Secondary | ICD-10-CM | POA: Diagnosis not present

## 2023-09-20 DIAGNOSIS — Z122 Encounter for screening for malignant neoplasm of respiratory organs: Secondary | ICD-10-CM

## 2023-09-20 DIAGNOSIS — Z87891 Personal history of nicotine dependence: Secondary | ICD-10-CM

## 2023-09-27 ENCOUNTER — Other Ambulatory Visit: Payer: Self-pay | Admitting: Acute Care

## 2023-09-27 DIAGNOSIS — Z87891 Personal history of nicotine dependence: Secondary | ICD-10-CM

## 2023-09-27 DIAGNOSIS — Z122 Encounter for screening for malignant neoplasm of respiratory organs: Secondary | ICD-10-CM

## 2023-12-13 ENCOUNTER — Other Ambulatory Visit (HOSPITAL_COMMUNITY): Payer: Self-pay

## 2023-12-13 ENCOUNTER — Telehealth: Payer: Self-pay

## 2023-12-13 NOTE — Telephone Encounter (Signed)
 Pharmacy Patient Advocate Encounter  Received notification from [employer] that Prior Authorization for Omeprazole  20MG  dr capsules has been APPROVED from 12/13/23 to 12/12/24. Ran test claim, Copay is $1.03. This test claim was processed through [facility]- copay amounts may vary at other pharmacies due to pharmacy/plan contracts, or as the patient moves through the different stages of their insurance plan.   PA #/Case ID/Reference #: [id number]

## 2023-12-17 ENCOUNTER — Encounter (HOSPITAL_COMMUNITY): Payer: Self-pay

## 2023-12-24 ENCOUNTER — Encounter: Payer: Self-pay | Admitting: Nurse Practitioner

## 2023-12-24 ENCOUNTER — Ambulatory Visit: Payer: 59 | Admitting: Nurse Practitioner

## 2023-12-24 VITALS — BP 119/70 | HR 111 | Temp 97.5°F | Ht 64.0 in | Wt 136.0 lb

## 2023-12-24 DIAGNOSIS — Z720 Tobacco use: Secondary | ICD-10-CM | POA: Diagnosis not present

## 2023-12-24 DIAGNOSIS — K219 Gastro-esophageal reflux disease without esophagitis: Secondary | ICD-10-CM | POA: Diagnosis not present

## 2023-12-24 DIAGNOSIS — Z6825 Body mass index (BMI) 25.0-25.9, adult: Secondary | ICD-10-CM

## 2023-12-24 DIAGNOSIS — E782 Mixed hyperlipidemia: Secondary | ICD-10-CM | POA: Diagnosis not present

## 2023-12-24 DIAGNOSIS — Z23 Encounter for immunization: Secondary | ICD-10-CM

## 2023-12-24 DIAGNOSIS — E039 Hypothyroidism, unspecified: Secondary | ICD-10-CM

## 2023-12-24 MED ORDER — ROSUVASTATIN CALCIUM 10 MG PO TABS
10.0000 mg | ORAL_TABLET | Freq: Every day | ORAL | 1 refills | Status: DC
Start: 2023-12-24 — End: 2024-06-27

## 2023-12-24 MED ORDER — FENOFIBRATE 145 MG PO TABS
145.0000 mg | ORAL_TABLET | Freq: Every day | ORAL | 1 refills | Status: DC
Start: 2023-12-24 — End: 2024-06-27

## 2023-12-24 MED ORDER — LEVOTHYROXINE SODIUM 50 MCG PO TABS: 50.0000 ug | ORAL_TABLET | Freq: Every day | ORAL | 1 refills | Status: DC

## 2023-12-24 MED ORDER — OMEPRAZOLE 20 MG PO CPDR: 20.0000 mg | DELAYED_RELEASE_CAPSULE | Freq: Every day | ORAL | 1 refills | Status: DC

## 2023-12-24 NOTE — Addendum Note (Signed)
 Addended by: Delfina Feller on: 12/24/2023 03:27 PM   Modules accepted: Orders

## 2023-12-24 NOTE — Progress Notes (Signed)
 Subjective:    Patient ID: Deborah Osborne, female    DOB: Jan 08, 1971, 53 y.o.   MRN: [id number]   Chief Complaint: medical management of chronic issues     HPI:  Deborah Osborne is a 53 y.o. who identifies as a female who was assigned female at birth.   Social history: Lives with: husband Work history: [employer]   Comes in today for follow up of the following chronic medical issues:  1. Mixed hyperlipidemia Does not watch diet very closely. Does no exercise Lab Results  Component Value Date   CHOL 154 07/01/2023   HDL 29 (L) 07/01/2023   LDLCALC 84 07/01/2023   TRIG 245 (H) 07/01/2023   CHOLHDL 5.3 (H) 07/01/2023   The 10-year ASCVD risk score (Arnett DK, et al., 2019) is: 5.1%   2. Acquired hypothyroidism No issues that she is aware of Lab Results  Component Value Date   TSH 1.920 07/01/2023     3. Gastroesophageal reflux disease without esophagitis Is on omeprazole  and is doing well  4. Tobacco use Smokes over  a pack a day. Low dose ct scan was done 09/16/22. Negative- to continue yearly checks.  5. BMI 25.0-25.9,adult Weight is down 4 lbs  Wt Readings from Last 3 Encounters:  12/24/23 136 lb (61.7 kg)  07/01/23 140 lb (63.5 kg)  12/25/22 147 lb (66.7 kg)   BMI Readings from Last 3 Encounters:  12/24/23 23.34 kg/m  07/01/23 24.03 kg/m  12/25/22 25.23 kg/m      New complaints: None today  No Known Allergies Outpatient Encounter Medications as of 12/24/2023  Medication Sig   albuterol  (PROVENTIL ) (2.5 MG/3ML) 0.083% nebulizer solution Take 3 mLs (2.5 mg total) by nebulization every 6 (six) hours as needed for wheezing or shortness of breath.   Albuterol  Sulfate (PROAIR  RESPICLICK) 108 (90 Base) MCG/ACT AEPB Inhale 1-2 puffs into the lungs 4 (four) times daily as needed (shob, wheezing). prn   fenofibrate  (TRICOR ) 145 MG tablet Take 1 tablet (145 mg total) by mouth daily.   levothyroxine  (SYNTHROID ) 50 MCG tablet Take 1 tablet (50 mcg total)  by mouth daily.   Multiple Vitamins-Minerals (CENTRUM ADULTS PO) Take by mouth.   omeprazole  (PRILOSEC ) 20 MG capsule Take 1 capsule (20 mg total) by mouth daily.   OVER THE COUNTER MEDICATION Omega Red   rosuvastatin  (CRESTOR ) 10 MG tablet Take 1 tablet (10 mg total) by mouth daily.   [DISCONTINUED] dicyclomine  (BENTYL ) 10 MG capsule Take 1 capsule (10 mg total) by mouth 4 (four) times daily -  before meals and at bedtime.   No facility-administered encounter medications on file as of 12/24/2023.    Past Surgical History:  Procedure Laterality Date   CHOLECYSTECTOMY  2017   TUBAL LIGATION      Family History  Problem Relation Age of Onset   Arthritis Mother    Cancer Mother        ???   Diabetes Mother    Vision loss Mother    Arthritis Father    Early death Father    Heart disease Father    Stroke Father    Colon polyps Sister        Age 51s, large colon polyp requiring resection, advised to have her sibling complete colonoscopy   Arthritis Maternal Grandmother    Colon cancer Neg Hx    Breast cancer Neg Hx       Controlled substance contract: n/a     Review of Systems  Constitutional:  Negative for diaphoresis.  Eyes:  Negative for pain.  Respiratory:  Negative for shortness of breath.   Cardiovascular:  Negative for chest pain, palpitations and leg swelling.  Gastrointestinal:  Negative for abdominal pain.  Endocrine: Negative for polydipsia.  Skin:  Negative for rash.  Neurological:  Negative for dizziness, weakness and headaches.  Hematological:  Does not bruise/bleed easily.  All other systems reviewed and are negative.      Objective:   Physical Exam Vitals and nursing note reviewed.  Constitutional:      General: She is not in acute distress.    Appearance: Normal appearance. She is well-developed.  HENT:     Head: Normocephalic.     Right Ear: Tympanic membrane normal.     Left Ear: Tympanic membrane normal.     Nose: Nose normal.      Mouth/Throat:     Mouth: Mucous membranes are moist.  Eyes:     Pupils: Pupils are equal, round, and reactive to light.  Neck:     Vascular: No carotid bruit or JVD.  Cardiovascular:     Rate and Rhythm: Normal rate and regular rhythm.     Heart sounds: Normal heart sounds.  Pulmonary:     Effort: Pulmonary effort is normal. No respiratory distress.     Breath sounds: Normal breath sounds. No wheezing or rales.  Chest:     Chest wall: No tenderness.  Abdominal:     General: Bowel sounds are normal. There is no distension or abdominal bruit.     Palpations: Abdomen is soft. There is no hepatomegaly, splenomegaly, mass or pulsatile mass.     Tenderness: There is no abdominal tenderness.  Musculoskeletal:        General: Normal range of motion.     Cervical back: Normal range of motion and neck supple.  Lymphadenopathy:     Cervical: No cervical adenopathy.  Skin:    General: Skin is warm and dry.  Neurological:     Mental Status: She is alert and oriented to person, place, and time.     Deep Tendon Reflexes: Reflexes are normal and symmetric.  Psychiatric:        Behavior: Behavior normal.        Thought Content: Thought content normal.        Judgment: Judgment normal.     BP 119/70   Pulse (!) 111   Temp (!) 97.5 F (36.4 C) (Temporal)   Ht 5\' 4"  (1.626 m)   Wt 136 lb (61.7 kg)   SpO2 96%   BMI 23.34 kg/m        Assessment & Plan:   Deborah Osborne comes in today with chief complaint of Medical Management of Chronic Issues   Diagnosis and orders addressed:  1. Mixed hyperlipidemia Low fat diet - fenofibrate  (TRICOR ) 145 MG tablet; Take 1 tablet (145 mg total) by mouth daily.  Dispense: 90 tablet; Refill: 1 - rosuvastatin  (CRESTOR ) 10 MG tablet; Take 1 tablet (10 mg total) by mouth daily.  Dispense: 90 tablet; Refill: 1 - CBC with Differential/Platelet - CMP14+EGFR - Lipid panel  2. Acquired hypothyroidism Labs pending - levothyroxine  (SYNTHROID ) 50 MCG  tablet; Take 1 tablet (50 mcg total) by mouth daily.  Dispense: 90 tablet; Refill: 1 - Thyroid  Panel With TSH  3. Gastroesophageal reflux disease without esophagitis Avoid spicy foods Do not eat 2 hours prior to bedtime - omeprazole  (PRILOSEC ) 20 MG capsule; Take 1 capsule (20 mg total) by mouth daily.  Dispense: 90 capsule; Refill: 1  4. Tobacco use Smoking cessation encouraged  5. BMI 25.0-25.9,adult Discussed diet and exercise for person with BMI >25 Will recheck weight in 3-6 months    Labs pending Health Maintenance reviewed Diet and exercise encouraged  Follow up plan: 6 months   Mary-Margaret Gaylyn Keas, FNP

## 2023-12-24 NOTE — Patient Instructions (Signed)

## 2023-12-24 NOTE — Addendum Note (Signed)
 Addended by: Cherylyn Cos on: 12/24/2023 03:29 PM   Modules accepted: Orders

## 2023-12-25 LAB — CBC WITH DIFFERENTIAL/PLATELET
Basophils Absolute: 0.1 10*3/uL (ref 0.0–0.2)
Basos: 1 %
EOS (ABSOLUTE): 0.1 10*3/uL (ref 0.0–0.4)
Eos: 1 %
Hematocrit: 39.7 % (ref 34.0–46.6)
Hemoglobin: 12.6 g/dL (ref 11.1–15.9)
Immature Grans (Abs): 0 10*3/uL (ref 0.0–0.1)
Immature Granulocytes: 0 %
Lymphocytes Absolute: 3.3 10*3/uL — ABNORMAL HIGH (ref 0.7–3.1)
Lymphs: 33 %
MCH: 29.8 pg (ref 26.6–33.0)
MCHC: 31.7 g/dL (ref 31.5–35.7)
MCV: 94 fL (ref 79–97)
Monocytes Absolute: 0.8 10*3/uL (ref 0.1–0.9)
Monocytes: 8 %
Neutrophils Absolute: 5.7 10*3/uL (ref 1.4–7.0)
Neutrophils: 57 %
Platelets: 370 10*3/uL (ref 150–450)
RBC: 4.23 x10E6/uL (ref 3.77–5.28)
RDW: 13.4 % (ref 11.7–15.4)
WBC: 9.9 10*3/uL (ref 3.4–10.8)

## 2023-12-25 LAB — CMP14+EGFR
ALT: 8 IU/L (ref 0–32)
AST: 14 IU/L (ref 0–40)
Albumin: 4.2 g/dL (ref 3.8–4.9)
Alkaline Phosphatase: 56 IU/L (ref 44–121)
BUN/Creatinine Ratio: 8 — ABNORMAL LOW (ref 9–23)
BUN: 9 mg/dL (ref 6–24)
Bilirubin Total: <0.2 mg/dL (ref 0.0–1.2)
CO2: 23 mmol/L (ref 20–29)
Calcium: 9.8 mg/dL (ref 8.7–10.2)
Chloride: 101 mmol/L (ref 96–106)
Creatinine, Ser: 1.07 mg/dL — ABNORMAL HIGH (ref 0.57–1.00)
Globulin, Total: 2.5 g/dL (ref 1.5–4.5)
Glucose: 82 mg/dL (ref 70–99)
Potassium: 4.6 mmol/L (ref 3.5–5.2)
Sodium: 140 mmol/L (ref 134–144)
Total Protein: 6.7 g/dL (ref 6.0–8.5)
eGFR: 62 mL/min/{1.73_m2} (ref >59)

## 2023-12-25 LAB — LIPID PANEL
Chol/HDL Ratio: 5.7 ratio — ABNORMAL HIGH (ref 0.0–4.4)
Cholesterol, Total: 170 mg/dL (ref 100–199)
HDL: 30 mg/dL — ABNORMAL LOW (ref >39)
LDL Chol Calc (NIH): 94 mg/dL (ref 0–99)
Triglycerides: 272 mg/dL — ABNORMAL HIGH (ref 0–149)
VLDL Cholesterol Cal: 46 mg/dL — ABNORMAL HIGH (ref 5–40)

## 2023-12-25 LAB — THYROID PANEL WITH TSH
Free Thyroxine Index: 2.6 (ref 1.2–4.9)
T3 Uptake Ratio: 26 % (ref 24–39)
T4, Total: 9.9 ug/dL (ref 4.5–12.0)
TSH: 1.99 u[IU]/mL (ref 0.450–4.500)

## 2023-12-27 ENCOUNTER — Ambulatory Visit: Payer: Self-pay | Admitting: Nurse Practitioner

## 2024-06-27 ENCOUNTER — Ambulatory Visit: Payer: Self-pay | Admitting: Nurse Practitioner

## 2024-06-27 ENCOUNTER — Encounter: Payer: Self-pay | Admitting: Nurse Practitioner

## 2024-06-27 VITALS — BP 137/82 | HR 71 | Temp 97.8°F | Ht 64.0 in | Wt 130.0 lb

## 2024-06-27 DIAGNOSIS — Z720 Tobacco use: Secondary | ICD-10-CM | POA: Diagnosis not present

## 2024-06-27 DIAGNOSIS — W57XXXA Bitten or stung by nonvenomous insect and other nonvenomous arthropods, initial encounter: Secondary | ICD-10-CM | POA: Diagnosis not present

## 2024-06-27 DIAGNOSIS — E039 Hypothyroidism, unspecified: Secondary | ICD-10-CM | POA: Diagnosis not present

## 2024-06-27 DIAGNOSIS — K219 Gastro-esophageal reflux disease without esophagitis: Secondary | ICD-10-CM | POA: Diagnosis not present

## 2024-06-27 DIAGNOSIS — Z23 Encounter for immunization: Secondary | ICD-10-CM

## 2024-06-27 DIAGNOSIS — Z83719 Family history of colon polyps, unspecified: Secondary | ICD-10-CM | POA: Diagnosis not present

## 2024-06-27 DIAGNOSIS — S40262A Insect bite (nonvenomous) of left shoulder, initial encounter: Secondary | ICD-10-CM | POA: Diagnosis not present

## 2024-06-27 DIAGNOSIS — E782 Mixed hyperlipidemia: Secondary | ICD-10-CM

## 2024-06-27 MED ORDER — OMEPRAZOLE 20 MG PO CPDR
20.0000 mg | DELAYED_RELEASE_CAPSULE | Freq: Every day | ORAL | 1 refills | Status: AC
Start: 2024-06-27 — End: ?

## 2024-06-27 MED ORDER — FENOFIBRATE 145 MG PO TABS: 145.0000 mg | ORAL_TABLET | Freq: Every day | ORAL | 1 refills | Status: AC

## 2024-06-27 MED ORDER — ROSUVASTATIN CALCIUM 10 MG PO TABS: 10.0000 mg | ORAL_TABLET | Freq: Every day | ORAL | 1 refills | Status: AC

## 2024-06-27 MED ORDER — LEVOTHYROXINE SODIUM 50 MCG PO TABS: 50.0000 ug | ORAL_TABLET | Freq: Every day | ORAL | 1 refills | Status: AC

## 2024-06-27 NOTE — Patient Instructions (Signed)
 Diarrhea, Adult Diarrhea is when you pass loose and sometimes watery poop (stool) often. Diarrhea can make you feel weak and cause you to lose water in your body (get dehydrated). Losing water in your body can cause you to: Feel tired and thirsty. Have a dry mouth. Go pee (urinate) less often. Diarrhea often lasts 2-3 days. It can last longer if it is a sign of something more serious. Be sure to treat your diarrhea as told by your doctor. Follow these instructions at home: Eating and drinking     Follow these instructions as told by your doctor: Take an ORS (oral rehydration solution). This is a drink that helps you replace fluids and minerals your body lost. It is sold at pharmacies and stores. Drink enough fluid to keep your pee (urine) pale yellow. Drink fluids such as: Water. You can also get fluids by sucking on ice chips. Diluted fruit juice. Low-calorie sports drinks. Milk. Avoid drinking fluids that have a lot of sugar or caffeine in them. These include soda, energy drinks, and regular sports drinks. Avoid alcohol. Eat bland, easy-to-digest foods in small amounts as you are able. These foods include: Bananas. Applesauce. Rice. Low-fat (lean) meats. Toast. Crackers. Avoid spicy or fatty foods.  Medicines Take over-the-counter and prescription medicines only as told by your doctor. If you were prescribed antibiotics, take them as told by your doctor. Do not stop taking them even if you start to feel better. General instructions  Wash your hands often using soap and water for 20 seconds. If soap and water are not available, use hand sanitizer. Others in your home should wash their hands as well. Wash your hands: After using the toilet or changing a diaper. Before preparing, cooking, or serving food. While caring for a sick person. While visiting someone in a hospital. Rest at home while you get better. Take a warm bath to help with any burning or pain from having  diarrhea. Watch your condition for any changes. Contact a doctor if: You have a fever. Your diarrhea gets worse. You have new symptoms. You vomit every time you eat or drink. You feel light-headed, dizzy, or you have a headache. You have muscle cramps. You have signs of losing too much water in your body, such as: Dark pee, very little pee, or no pee. Cracked lips. Dry mouth. Sunken eyes. Sleepiness. Weakness. You have bloody or black poop or poop that looks like tar. You have very bad pain, cramping, or bloating in your belly (abdomen). Your skin feels cold and clammy. You feel confused. Get help right away if: You have chest pain. Your heart is beating very quickly. You have trouble breathing or you are breathing very quickly. You feel very weak or you faint. These symptoms may be an emergency. Get help right away. Call 911. Do not wait to see if the symptoms will go away. Do not drive yourself to the hospital. This information is not intended to replace advice given to you by your health care provider. Make sure you discuss any questions you have with your health care provider. Document Revised: 01/13/2022 Document Reviewed: 01/13/2022 Elsevier Patient Education  2024 [employer].

## 2024-06-27 NOTE — Progress Notes (Signed)
 Subjective:    Patient ID: Deborah Osborne, female    DOB: 06-05-1971, 53 y.o.   MRN: [id number]   Chief Complaint: medical management of chronic issues     HPI:  Deborah Osborne is a 53 y.o. who identifies as a female who was assigned female at birth.   Social history: Lives with: husband Work history: [employer]   Comes in today for follow up of the following chronic medical issues:  1. Mixed hyperlipidemia Does not watch diet very closely. Does no exercise Lab Results  Component Value Date   CHOL 170 12/24/2023   HDL 30 (L) 12/24/2023   LDLCALC 94 12/24/2023   TRIG 272 (H) 12/24/2023   CHOLHDL 5.7 (H) 12/24/2023   The 10-year ASCVD risk score (Arnett DK, et al., 2019) is: 7.3%   2. Acquired hypothyroidism No issues that she is aware of Lab Results  Component Value Date   TSH 1.990 12/24/2023     3. Gastroesophageal reflux disease without esophagitis Is on omeprazole  and is doing well  4. Tobacco use Smokes over  a pack a day. Low dose ct scan was done 09/16/22. Negative- to continue yearly checks.  5. BMI 25.0-25.9,adult Weight is down 6 lbs Wt Readings from Last 3 Encounters:  06/27/24 130 lb (59 kg)  12/24/23 136 lb (61.7 kg)  07/01/23 140 lb (63.5 kg)   BMI Readings from Last 3 Encounters:  06/27/24 22.31 kg/m  12/24/23 23.34 kg/m  07/01/23 24.03 kg/m     New complaints: -Tick bite on upper back about 4 months. Still itches. -diarrhea - loose stool daily. Saw [facility] and they gave her bentyl  which did not help. So she stopped taking yet. No Known Allergies Outpatient Encounter Medications as of 06/27/2024  Medication Sig   albuterol  (PROVENTIL ) (2.5 MG/3ML) 0.083% nebulizer solution Take 3 mLs (2.5 mg total) by nebulization every 6 (six) hours as needed for wheezing or shortness of breath.   Albuterol  Sulfate (PROAIR  RESPICLICK) 108 (90 Base) MCG/ACT AEPB Inhale 1-2 puffs into the lungs 4 (four) times daily as needed (shob, wheezing). prn    fenofibrate  (TRICOR ) 145 MG tablet Take 1 tablet (145 mg total) by mouth daily.   levothyroxine  (SYNTHROID ) 50 MCG tablet Take 1 tablet (50 mcg total) by mouth daily.   Multiple Vitamins-Minerals (CENTRUM ADULTS PO) Take by mouth.   omeprazole  (PRILOSEC ) 20 MG capsule Take 1 capsule (20 mg total) by mouth daily.   OVER THE COUNTER MEDICATION Omega Red   rosuvastatin  (CRESTOR ) 10 MG tablet Take 1 tablet (10 mg total) by mouth daily.   No facility-administered encounter medications on file as of 06/27/2024.    Past Surgical History:  Procedure Laterality Date   CHOLECYSTECTOMY  2017   TUBAL LIGATION      Family History  Problem Relation Age of Onset   Arthritis Mother    Cancer Mother        ???   Diabetes Mother    Vision loss Mother    Arthritis Father    Early death Father    Heart disease Father    Stroke Father    Colon polyps Sister        Age 70s, large colon polyp requiring resection, advised to have her sibling complete colonoscopy   Arthritis Maternal Grandmother    Colon cancer Neg Hx    Breast cancer Neg Hx       Controlled substance contract: n/a     Review of Systems  Constitutional:  Negative for diaphoresis.  Eyes:  Negative for pain.  Respiratory:  Negative for shortness of breath.   Cardiovascular:  Negative for chest pain, palpitations and leg swelling.  Gastrointestinal:  Negative for abdominal pain.  Endocrine: Negative for polydipsia.  Skin:  Negative for rash.  Neurological:  Negative for dizziness, weakness and headaches.  Hematological:  Does not bruise/bleed easily.  All other systems reviewed and are negative.      Objective:   Physical Exam Vitals and nursing note reviewed.  Constitutional:      General: She is not in acute distress.    Appearance: Normal appearance. She is well-developed.  HENT:     Head: Normocephalic.     Right Ear: Tympanic membrane normal.     Left Ear: Tympanic membrane normal.     Nose: Nose normal.      Mouth/Throat:     Mouth: Mucous membranes are moist.  Eyes:     Pupils: Pupils are equal, round, and reactive to light.  Neck:     Vascular: No carotid bruit or JVD.  Cardiovascular:     Rate and Rhythm: Normal rate and regular rhythm.     Heart sounds: Normal heart sounds.  Pulmonary:     Effort: Pulmonary effort is normal. No respiratory distress.     Breath sounds: Normal breath sounds. No wheezing or rales.  Chest:     Chest wall: No tenderness.  Abdominal:     General: Bowel sounds are normal. There is no distension or abdominal bruit.     Palpations: Abdomen is soft. There is no hepatomegaly, splenomegaly, mass or pulsatile mass.     Tenderness: There is no abdominal tenderness.  Musculoskeletal:        General: Normal range of motion.     Cervical back: Normal range of motion and neck supple.  Lymphadenopathy:     Cervical: No cervical adenopathy.  Skin:    General: Skin is warm and dry.  Neurological:     Mental Status: She is alert and oriented to person, place, and time.     Deep Tendon Reflexes: Reflexes are normal and symmetric.  Psychiatric:        Behavior: Behavior normal.        Thought Content: Thought content normal.        Judgment: Judgment normal.     BP 137/82   Pulse 71   Temp 97.8 F (36.6 C) (Temporal)   Ht 5' 4 (1.626 m)   Wt 130 lb (59 kg)   SpO2 97%   BMI 22.31 kg/m        Assessment & Plan:   Deborah Osborne comes in today with chief complaint of No chief complaint on file.   Diagnosis and orders addressed:  1. Mixed hyperlipidemia (Primary) Low fat diet - CBC with Differential/Platelet - CMP14+EGFR - Lipid panel - fenofibrate  (TRICOR ) 145 MG tablet; Take 1 tablet (145 mg total) by mouth daily.  Dispense: 90 tablet; Refill: 1 - rosuvastatin  (CRESTOR ) 10 MG tablet; Take 1 tablet (10 mg total) by mouth daily.  Dispense: 90 tablet; Refill: 1  2. Acquired hypothyroidism Labs pending - Thyroid  Panel With TSH - levothyroxine   (SYNTHROID ) 50 MCG tablet; Take 1 tablet (50 mcg total) by mouth daily.  Dispense: 90 tablet; Refill: 1  3. Tobacco use Smoking cessation encouraged  4. Gastroesophageal reflux disease without esophagitis Avoid spicy foods Do not eat 2 hours prior to bedtime  - omeprazole  (PRILOSEC ) 20 MG capsule; Take 1  capsule (20 mg total) by mouth daily.  Dispense: 90 capsule; Refill: 1  5. FH: colon polyps Keep follow as indicated by GI  6. Tick bite of left shoulder, initial encounter Labs pending - Lyme Disease Serology w/Reflex  7. Diarrhea Going try taking some probiotics  Labs pending Health Maintenance reviewed Diet and exercise encouraged  Follow up plan: 6 months   Deborah Gladis, FNP

## 2024-06-28 LAB — CBC WITH DIFFERENTIAL/PLATELET
Basophils Absolute: 0.1 x10E3/uL (ref 0.0–0.2)
Basos: 1 %
EOS (ABSOLUTE): 0.2 x10E3/uL (ref 0.0–0.4)
Eos: 2 %
Hematocrit: 41.6 % (ref 34.0–46.6)
Hemoglobin: 13.8 g/dL (ref 11.1–15.9)
Immature Grans (Abs): 0.1 x10E3/uL (ref 0.0–0.1)
Immature Granulocytes: 1 %
Lymphocytes Absolute: 4.1 x10E3/uL — ABNORMAL HIGH (ref 0.7–3.1)
Lymphs: 33 %
MCH: 30.7 pg (ref 26.6–33.0)
MCHC: 33.2 g/dL (ref 31.5–35.7)
MCV: 92 fL (ref 79–97)
Monocytes Absolute: 0.8 x10E3/uL (ref 0.1–0.9)
Monocytes: 6 %
Neutrophils Absolute: 7.4 x10E3/uL — ABNORMAL HIGH (ref 1.4–7.0)
Neutrophils: 57 %
Platelets: 397 x10E3/uL (ref 150–450)
RBC: 4.5 x10E6/uL (ref 3.77–5.28)
RDW: 12.9 % (ref 11.7–15.4)
WBC: 12.7 x10E3/uL — ABNORMAL HIGH (ref 3.4–10.8)

## 2024-06-28 LAB — CMP14+EGFR
ALT: 9 IU/L (ref 0–32)
AST: 21 IU/L (ref 0–40)
Albumin: 4.7 g/dL (ref 3.8–4.9)
Alkaline Phosphatase: 59 IU/L (ref 49–135)
BUN/Creatinine Ratio: 11 (ref 9–23)
BUN: 12 mg/dL (ref 6–24)
Bilirubin Total: 0.3 mg/dL (ref 0.0–1.2)
CO2: 20 mmol/L (ref 20–29)
Calcium: 10.4 mg/dL — ABNORMAL HIGH (ref 8.7–10.2)
Chloride: 100 mmol/L (ref 96–106)
Creatinine, Ser: 1.09 mg/dL — ABNORMAL HIGH (ref 0.57–1.00)
Globulin, Total: 2.6 g/dL (ref 1.5–4.5)
Glucose: 82 mg/dL (ref 70–99)
Potassium: 4.9 mmol/L (ref 3.5–5.2)
Sodium: 137 mmol/L (ref 134–144)
Total Protein: 7.3 g/dL (ref 6.0–8.5)
eGFR: 61 mL/min/1.73 (ref >59)

## 2024-06-28 LAB — LIPID PANEL
Chol/HDL Ratio: 5.6 ratio — ABNORMAL HIGH (ref 0.0–4.4)
Cholesterol, Total: 185 mg/dL (ref 100–199)
HDL: 33 mg/dL — ABNORMAL LOW (ref >39)
LDL Chol Calc (NIH): 111 mg/dL — ABNORMAL HIGH (ref 0–99)
Triglycerides: 234 mg/dL — ABNORMAL HIGH (ref 0–149)
VLDL Cholesterol Cal: 41 mg/dL — ABNORMAL HIGH (ref 5–40)

## 2024-06-28 LAB — THYROID PANEL WITH TSH
Free Thyroxine Index: 2.4 (ref 1.2–4.9)
T3 Uptake Ratio: 25 % (ref 24–39)
T4, Total: 9.5 ug/dL (ref 4.5–12.0)
TSH: 2.03 u[IU]/mL (ref 0.450–4.500)

## 2024-06-28 LAB — LYME DISEASE SEROLOGY W/REFLEX: Lyme Total Antibody EIA: NEGATIVE

## 2024-06-29 ENCOUNTER — Ambulatory Visit: Payer: Self-pay | Admitting: Nurse Practitioner

## 2024-09-20 ENCOUNTER — Ambulatory Visit (HOSPITAL_COMMUNITY)

## 2024-09-20 ENCOUNTER — Other Ambulatory Visit
